# Patient Record
Sex: Male | Born: 1970 | Race: Black or African American | Hispanic: No | Marital: Single | Smoking: Former smoker
Health system: Southern US, Community
[De-identification: ages and names within clinical notes are randomized; demographics above are authoritative.]

## PROBLEM LIST (undated history)

## (undated) DIAGNOSIS — E876 Hypokalemia: Secondary | ICD-10-CM

## (undated) DIAGNOSIS — I10 Essential (primary) hypertension: Secondary | ICD-10-CM

## (undated) DIAGNOSIS — F410 Panic disorder [episodic paroxysmal anxiety] without agoraphobia: Secondary | ICD-10-CM

## (undated) DIAGNOSIS — E785 Hyperlipidemia, unspecified: Secondary | ICD-10-CM

## (undated) HISTORY — PX: HERNIA REPAIR: SHX51

---

## 2014-03-14 ENCOUNTER — Emergency Department (HOSPITAL_BASED_OUTPATIENT_CLINIC_OR_DEPARTMENT_OTHER)
Admission: EM | Admit: 2014-03-14 | Discharge: 2014-03-14 | Disposition: A | Discharge status: Home / Self Care | Payer: Self-pay | Attending: Emergency Medicine | Admitting: Emergency Medicine

## 2014-03-14 ENCOUNTER — Encounter (HOSPITAL_BASED_OUTPATIENT_CLINIC_OR_DEPARTMENT_OTHER): Payer: Self-pay | Admitting: Emergency Medicine

## 2014-03-14 DIAGNOSIS — M545 Low back pain, unspecified: Secondary | ICD-10-CM | POA: Insufficient documentation

## 2014-03-14 DIAGNOSIS — M25562 Pain in left knee: Secondary | ICD-10-CM

## 2014-03-14 DIAGNOSIS — R519 Headache, unspecified: Secondary | ICD-10-CM

## 2014-03-14 DIAGNOSIS — G629 Polyneuropathy, unspecified: Secondary | ICD-10-CM

## 2014-03-14 DIAGNOSIS — R51 Headache: Secondary | ICD-10-CM | POA: Insufficient documentation

## 2014-03-14 DIAGNOSIS — G589 Mononeuropathy, unspecified: Secondary | ICD-10-CM | POA: Insufficient documentation

## 2014-03-14 DIAGNOSIS — M25469 Effusion, unspecified knee: Secondary | ICD-10-CM | POA: Insufficient documentation

## 2014-03-14 DIAGNOSIS — M25561 Pain in right knee: Secondary | ICD-10-CM

## 2014-03-14 DIAGNOSIS — Z87828 Personal history of other (healed) physical injury and trauma: Secondary | ICD-10-CM | POA: Insufficient documentation

## 2014-03-14 DIAGNOSIS — M25569 Pain in unspecified knee: Secondary | ICD-10-CM | POA: Insufficient documentation

## 2014-03-14 LAB — URINALYSIS, ROUTINE W REFLEX MICROSCOPIC
Bilirubin Urine: NEGATIVE
GLUCOSE, UA: NEGATIVE mg/dL
HGB URINE DIPSTICK: NEGATIVE
Ketones, ur: NEGATIVE mg/dL
Leukocytes, UA: NEGATIVE
Nitrite: NEGATIVE
Protein, ur: NEGATIVE mg/dL
SPECIFIC GRAVITY, URINE: 1.013 (ref 1.005–1.030)
UROBILINOGEN UA: 0.2 mg/dL (ref 0.0–1.0)
pH: 5.5 (ref 5.0–8.0)

## 2014-03-14 MED ORDER — IBUPROFEN 800 MG PO TABS: 800.0000 mg | ORAL_TABLET | Freq: Three times a day (TID) | ORAL | Status: DC | PRN

## 2014-03-14 MED ORDER — HYDROCODONE-ACETAMINOPHEN 5-325 MG PO TABS
1.0000 | ORAL_TABLET | ORAL | Status: DC | PRN
Start: 2014-03-14 — End: 2015-09-25

## 2014-03-14 NOTE — Discharge Instructions (Signed)
Back Pain, Adult Low back pain is very common. About 1 in 5 people have back pain.The cause of low back pain is rarely dangerous. The pain often gets better over time.About half of people with a sudden onset of back pain feel better in just 2 weeks. About 8 in 10 people feel better by 6 weeks.  CAUSES Some common causes of back pain include:  Strain of the muscles or ligaments supporting the spine.  Wear and tear (degeneration) of the spinal discs.  Arthritis.  Direct injury to the back. DIAGNOSIS Most of the time, the direct cause of low back pain is not known.However, back pain can be treated effectively even when the exact cause of the pain is unknown.Answering your caregiver's questions about your overall health and symptoms is one of the most accurate ways to make sure the cause of your pain is not dangerous. If your caregiver needs more information, he or she may order lab work or imaging tests (X-rays or MRIs).However, even if imaging tests show changes in your back, this usually does not require surgery. HOME CARE INSTRUCTIONS For many people, back pain returns.Since low back pain is rarely dangerous, it is often a condition that people can learn to [facility] their own.   Remain active. It is stressful on the back to sit or stand in one place. Do not sit, drive, or stand in one place for more than 30 minutes at a time. Take short walks on level surfaces as soon as pain allows.Try to increase the length of time you walk each day.  Do not stay in bed.Resting more than 1 or 2 days can delay your recovery.  Do not avoid exercise or work.Your body is made to move.It is not dangerous to be active, even though your back may hurt.Your back will likely heal faster if you return to being active before your pain is gone.  Pay attention to your body when you bend and lift. Many people have less discomfortwhen lifting if they bend their knees, keep the load close to their bodies,and  avoid twisting. Often, the most comfortable positions are those that put less stress on your recovering back.  Find a comfortable position to sleep. Use a firm mattress and lie on your side with your knees slightly bent. If you lie on your back, put a pillow under your knees.  Only take over-the-counter or prescription medicines as directed by your caregiver. Over-the-counter medicines to reduce pain and inflammation are often the most helpful.Your caregiver may prescribe muscle relaxant drugs.These medicines help dull your pain so you can more quickly return to your normal activities and healthy exercise.  Put ice on the injured area.  Put ice in a plastic bag.  Place a towel between your skin and the bag.  Leave the ice on for 15-20 minutes, 03-04 times a day for the first 2 to 3 days. After that, ice and heat may be alternated to reduce pain and spasms.  Ask your caregiver about trying back exercises and gentle massage. This may be of some benefit.  Avoid feeling anxious or stressed.Stress increases muscle tension and can worsen back pain.It is important to recognize when you are anxious or stressed and learn ways to manage it.Exercise is a great option. SEEK MEDICAL CARE IF:  You have pain that is not relieved with rest or medicine.  You have pain that does not improve in 1 week.  You have new symptoms.  You are generally not feeling well. SEEK  IMMEDIATE MEDICAL CARE IF:   You have pain that radiates from your back into your legs.  You develop new bowel or bladder control problems.  You have unusual weakness or numbness in your arms or legs.  You develop nausea or vomiting.  You develop abdominal pain.  You feel faint. Document Released: 09/23/2005 Document Revised: 03/24/2012 Document Reviewed: 02/11/2011 [facility] Patient Information 2014 [place], [place].  Knee Pain Knee pain can be a result of an injury or other medical conditions. Treatment will depend on the  cause of your pain. HOME CARE  Only take medicine as told by your doctor.  Keep a healthy weight. Being overweight can make the knee hurt more.  Stretch before exercising or playing sports.  If there is constant knee pain, change the way you exercise. Ask your doctor for advice.  Make sure shoes fit well. Choose the right shoe for the sport or activity.  Protect your knees. Wear kneepads if needed.  Rest when you are tired. GET HELP RIGHT AWAY IF:   Your knee pain does not stop.  Your knee pain does not get better.  Your knee joint feels hot to the touch.  You have a fever. MAKE SURE YOU:   Understand these instructions.  Will watch this condition.  Will get help right away if you are not doing well or get worse. Document Released: 12/20/2008 Document Revised: 12/16/2011 Document Reviewed: 12/20/2008 [facility] Patient Information 2014 [place], [place].  Back Exercises Back exercises help treat and prevent back injuries. The goal is to increase your strength in your belly (abdominal) and back muscles. These exercises can also help with flexibility. Start these exercises when told by your doctor. HOME CARE Back exercises include: Pelvic Tilt.  Lie on your back with your knees bent. Tilt your pelvis until the lower part of your back is against the floor. Hold this position 5 to 10 sec. Repeat this exercise 5 to 10 times. Knee to Chest.  Pull 1 knee up against your chest and hold for 20 to 30 seconds. Repeat this with the other knee. This may be done with the other leg straight or bent, whichever feels better. Then, pull both knees up against your chest. Sit-Ups or Curl-Ups.  Bend your knees 90 degrees. Start with tilting your pelvis, and do a partial, slow sit-up. Only lift your upper half 30 to 45 degrees off the floor. Take at least 2 to 3 seonds for each sit-up. Do not do sit-ups with your knees out straight. If partial sit-ups are difficult, simply do the above but with  only tightening your belly (abdominal) muscles and holding it as told. Hip-Lift.  Lie on your back with your knees flexed 90 degrees. Push down with your feet and shoulders as you raise your hips 2 inches off the floor. Hold for 10 seconds, repeat 5 to 10 times. Back Arches.  Lie on your stomach. Prop yourself up on bent elbows. Slowly press on your hands, causing an arch in your low back. Repeat 3 to 5 times. Shoulder-Lifts.  Lie face down with arms beside your body. Keep hips and belly pressed to floor as you slowly lift your head and shoulders off the floor. Do not overdo your exercises. Be careful in the beginning. Exercises may cause you some mild back discomfort. If the pain lasts for more than 15 minutes, stop the exercises until you see your doctor. Improvement with exercise for back problems is slow.  Document Released: 10/26/2010 Document Revised: 12/16/2011 Document  Reviewed: 07/25/2011 ExitCare Patient Information 2014 [place], [place]. RICE: Routine Care for Injuries The routine care of many injuries includes Rest, Ice, Compression, and Elevation (RICE). HOME CARE INSTRUCTIONS  Rest is needed to allow your body to heal. Routine activities can usually be resumed when comfortable. Injured tendons and bones can take up to 6 weeks to heal. Tendons are the cord-like structures that attach muscle to bone.  Ice following an injury helps keep the swelling down and reduces pain.  Put ice in a plastic bag.  Place a towel between your skin and the bag.  Leave the ice on for 15-20 minutes, 03-04 times a day. Do this while awake, for the first 24 to 48 hours. After that, continue as directed by your caregiver.  Compression helps keep swelling down. It also gives support and helps with discomfort. If an elastic bandage has been applied, it should be removed and reapplied every 3 to 4 hours. It should not be applied tightly, but firmly enough to keep swelling down. Watch fingers or toes for  swelling, bluish discoloration, coldness, numbness, or excessive pain. If any of these problems occur, remove the bandage and reapply loosely. Contact your caregiver if these problems continue.  Elevation helps reduce swelling and decreases pain. With extremities, such as the arms, hands, legs, and feet, the injured area should be placed near or above the level of the heart, if possible. SEEK IMMEDIATE MEDICAL CARE IF:  You have persistent pain and swelling.  You develop redness, numbness, or unexpected weakness.  Your symptoms are getting worse rather than improving after several days. These symptoms may indicate that further evaluation or further X-rays are needed. Sometimes, X-rays may not show a small broken bone (fracture) until 1 week or 10 days later. Make a follow-up appointment with your caregiver. Ask when your X-ray results will be ready. Make sure you get your X-ray results. Document Released: 01/05/2001 Document Revised: 12/16/2011 Document Reviewed: 02/22/2011 [facility] Patient Information 2014 [place], [place].    Emergency Department Resource Guide 1) Find a Doctor and Pay Out of Pocket Although you won't have to find out who is covered by your insurance plan, it is a good idea to ask around and get recommendations. You will then need to call the office and see if the doctor you have chosen will accept you as a new patient and what types of options they offer for patients who are self-pay. Some doctors offer discounts or will set up payment plans for their patients who do not have insurance, but you will need to ask so you aren't surprised when you get to your appointment.  2) Contact Your Local Health Department Not all health departments have doctors that can see patients for sick visits, but many do, so it is worth a call to see if yours does. If you don't know where your local health department is, you can check in your phone book. The CDC also has a tool to help you locate  your state's health department, and many state websites also have listings of all of their local health departments.  3) Find a Walk-in Clinic If your illness is not likely to be very severe or complicated, you may want to try a walk in clinic. These are popping up all over the country in pharmacies, drugstores, and shopping centers. They're usually staffed by nurse practitioners or physician assistants that have been trained to treat common illnesses and complaints. They're usually fairly quick and inexpensive. However, if you have  serious medical issues or chronic medical problems, these are probably not your best option.  No Primary Care Doctor: - Call Health Connect at  [phone] - they can help you locate a primary care doctor that  accepts your insurance, provides certain services, etc. - Physician Referral Service- [phone]  Chronic Pain Problems: Organization         Address  Phone   Notes  [facility]  [phone] Patients need to be referred by their primary care doctor.   Medication Assistance: Organization         Address  Phone   Notes  [facility] [address] [place], [place] [address] [phone]) [phone] --Must be a resident of [facility] -- Must have NO insurance coverage whatsoever (no Medicaid/ Medicare, etc.) -- The pt. MUST have a primary care doctor that directs their care regularly and follows them in the community   MedAssist  [phone]) [phone]   [employer]  [phone]) [phone]    Agencies that provide inexpensive medical care: Organization         Address  Phone   Notes  [facility]  [phone]   [facility]    [phone]) [phone]   [facility] [address] [place], [place] [address] [phone]) [phone]   [facility] [address], [place] [phone]   [facility]    [phone]   [facility]     [phone]) [phone]   [facility]  [address], [place] Phone:  [phone], Fax:  [phone] Hours of Operation:  9 am - 6 pm, M-F.  Also accepts Medicaid/Medicare and self-pay.  [facility]  [address], [place] Phone: [phone](336) [phone], Fax: [phone]. Hours of Operation:  8:30 am - 5:30 pm, M-F.  Also accepts Medicaid and self-pay.  [facility] [place] [address], [place] Phone: [phone]) [phone]   [facility] [address], [place], [place] [phone], Ext. [phone] Mondays & Thursdays: 7-9 AM.  First 15 patients are seen on a first come, first serve basis.    Medicaid-accepting [facility] Providers:  Organization         Address  Phone   Notes  [facility] [address], [place] [phone] Also accepts self-pay patients.  [facility] [address] [place], [place]  [phone]) [phone]   [facility] [address], [place] [phone]) [phone]   [facility] [address] [place] [address], [place] [phone]) [phone]   Renaye RakersVeita Bland [address], [place]   [phone] Only accepts [place] Access [place] patients after they have their name applied to their card.   Self-Pay (no insurance) in [facility]:  Organization         Address  Phone   Notes  Sickle Cell Patients, [facility] [address], [place] [phone]   [facility] [address], [place] [phone]) [phone]   [facility] [place]  [address], [place] [phone]   [facility]/Dr. Osei-Bonsu  [address] [place] [address], [place] or [phone], [place] [phone]) [phone] Phone number for both [place] and [place] locations is  the same.  [facility] [address] [place] [phone]   [facility] [address], [place] or [address] [phone]   [facility] [address], [place] [phone], phone; [phone], fax Sees patients 1st and 3rd Saturday of every month.  Must not qualify for public or private insurance (i.e. Medicaid, Medicare, [place] Health Choice, Veterans' Benefits)  Household income should be no more than 200% of the poverty level The clinic cannot treat you if you are pregnant or think you are pregnant  Sexually transmitted diseases are not treated at the clinic.    Dental Care: Organization         Address  Phone  Notes  [facility] [address] [place], [place] [phone] Accepts children up to age 37 who are enrolled in [place] or [place] Health Choice; pregnant women with a Medicaid card; and children who have applied for Medicaid or [place] Health Choice, but were declined, whose parents can pay a reduced fee at time of service.  [facility]  [address], [place] [phone] Accepts children up to age 55 who are enrolled in [place] or [place] Health Choice; pregnant women with a Medicaid card; and children who have applied for Medicaid or [place] Health Choice, but were declined, whose parents can pay a reduced fee at time of service.  [facility]  [address] [place], [place] [phone] Patients are seen by appointment only. Walk-ins are not accepted. [facility] will see patients 62 years of age and older. Monday - Tuesday (8am-5pm) Most Wednesdays (8:30-5pm) $30 per visit, cash only  [facility]  [address], [facility] [phone] Patients are seen by appointment only. Walk-ins are not accepted. [facility] will see patients 21 years of age and older. One Wednesday Evening (Monthly: Volunteer  Based).  $30 per visit, cash only  [employer]  [phone] for adults; Children under age 58, call Graduate Pediatric Dentistry at [phone]. Children aged 29-14, please call [phone] to request a pediatric application.  Dental services are provided in all areas of dental care including fillings, crowns and bridges, complete and partial dentures, implants, gum treatment, root canals, and extractions. Preventive care is also provided. Treatment is provided to both adults and children. Patients are selected via a lottery and there is often a waiting list.   [facility] [address] [place] [address] [place]  [phone] [email or web address]   [facility] [address] [place], [place] [phone], Ext. [phone] Second and Fourth Thursday of each month, opens at 6:30 AM; Clinic ends at 9 AM.  Patients are seen on a first-come first-served basis, and a limited number are seen during each clinic.   [facility]  [address] [place], [place] [phone]   Eligibility Requirements You must have lived in [place], [place], or [place] counties for at least the last three months.   You cannot be eligible for state or federal sponsored [place], including [employer], [place], or [employer].   You generally cannot be eligible for healthcare insurance through your employer.    How to apply: Eligibility screenings are held every Tuesday and Wednesday afternoon from 1:00 pm until 4:00 pm. You do not need an appointment  for the interview!  [facility] [address], [place], [place] [phone]   [facility]  [phone]   [facility] Health Department  [phone]   [facility] Health Department  [phone]    Behavioral Health Resources in the Community: Intensive Outpatient Programs Organization         Address  Phone  Notes  [facility]  Services [address] [place], [place] [phone]   [facility] Outpatient [address] [place], [place] [phone]   [facility] [address], [place], [place]  [phone]   [facility] Mental Health [address]  [place], [place] [phone] or [phone]   Substance Abuse Resources Organization         Address  Phone  Notes  [facility] Drug Services  [phone]   [facility]  [phone]   [facility] [place]  [phone]   Floydene Flock  [phone]   Residential & Outpatient Substance Abuse Program  [phone]   Psychological Services Organization         Address  Phone  Notes  [facility] Health  [phone]   [facility]  [phone]   [facility] Mental Health [address] [place] [phone] or [phone]    Mobile Crisis Teams Organization         Address  Phone  Notes  [facility], Mobile Crisis Care Unit  [phone]   [facility]  [address] [place], [place] [phone]   Doristine Locks [address] [place] [phone]    Self-Help/Support Groups Organization         Address  Phone             Notes  [facility] - variety of support groups  [phone]- I7437963 Call for more information  Narcotics Anonymous (NA), [facility] [address], [employer] [place]  2 meetings at this location   Statistician         Address  Phone  Notes  [facility] [address],    [place]  [phone]   [facility]  [address] [place] [address], [place], [place] [phone]   [facility] [address] [place], [place] [phone] Admissions: 8am-3pm M-F  [facility] [address],    [place], [place] [phone]   [facility] [address] [place], [place], [place] [phone]    [facility] [address],  [place], [place] [phone]   [facility] - Intensive Outpatient [address], [place], [place] [phone]   [facility] ([facility]iction [facility] [address] [place]  [place], [place] [phone] or [phone]   [facility] (RTS) [address], [place], [place] [phone] Accepts Medicaid  [facility] [place] [address],  [place] [phone] Substance Abuse/Addiction Treatment   [facility] Organization         Address  Phone  Notes  [facility]  [phone]   Angie Fava, PhD [address] [place], [place]   [phone] or [phone]   [facility] Behavioral   [address] [place], [place] [phone]   [facility] [address], [place], [place] [phone] Insurance/Medicaid/sponsorship through [employer] and Families [address]  [place], [place] [phone] [place], [place] [phone]    Dr. Adele Schilder  [phone]   [facility] Dept. 1) [address] [place] 2) [place] 3)  [place], [place] [phone]  [phone]   [place] [phone] or [phone] (After Hours)
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
## 2014-03-14 NOTE — ED Notes (Signed)
Lower back pain with radiation into his left leg. Knees have been swelling with standing for the past month.

## 2015-03-30 ENCOUNTER — Encounter (HOSPITAL_BASED_OUTPATIENT_CLINIC_OR_DEPARTMENT_OTHER): Payer: Self-pay | Admitting: *Deleted

## 2015-03-30 ENCOUNTER — Other Ambulatory Visit: Payer: Self-pay

## 2015-03-30 ENCOUNTER — Emergency Department (HOSPITAL_BASED_OUTPATIENT_CLINIC_OR_DEPARTMENT_OTHER)
Admission: EM | Admit: 2015-03-30 | Discharge: 2015-03-30 | Disposition: A | Discharge status: Home / Self Care | Payer: Self-pay | Attending: Emergency Medicine | Admitting: Emergency Medicine

## 2015-03-30 DIAGNOSIS — R109 Unspecified abdominal pain: Secondary | ICD-10-CM | POA: Insufficient documentation

## 2015-03-30 DIAGNOSIS — R079 Chest pain, unspecified: Secondary | ICD-10-CM | POA: Insufficient documentation

## 2015-03-30 DIAGNOSIS — R531 Weakness: Secondary | ICD-10-CM

## 2015-03-30 DIAGNOSIS — E876 Hypokalemia: Secondary | ICD-10-CM | POA: Insufficient documentation

## 2015-03-30 DIAGNOSIS — Z79899 Other long term (current) drug therapy: Secondary | ICD-10-CM | POA: Insufficient documentation

## 2015-03-30 DIAGNOSIS — I10 Essential (primary) hypertension: Secondary | ICD-10-CM | POA: Insufficient documentation

## 2015-03-30 DIAGNOSIS — R0602 Shortness of breath: Secondary | ICD-10-CM | POA: Insufficient documentation

## 2015-03-30 HISTORY — DX: Essential (primary) hypertension: I10

## 2015-03-30 HISTORY — DX: Hyperlipidemia, unspecified: E78.5

## 2015-03-30 LAB — COMPREHENSIVE METABOLIC PANEL
ALT: 36 U/L (ref 17–63)
AST: 33 U/L (ref 15–41)
Albumin: 4.7 g/dL (ref 3.5–5.0)
Alkaline Phosphatase: 65 U/L (ref 38–126)
Anion gap: 11 (ref 5–15)
BUN: 12 mg/dL (ref 6–20)
CO2: 28 mmol/L (ref 22–32)
Calcium: 9.9 mg/dL (ref 8.9–10.3)
Chloride: 94 mmol/L — ABNORMAL LOW (ref 101–111)
Creatinine, Ser: 1.2 mg/dL (ref 0.61–1.24)
GFR calc Af Amer: >60 mL/min (ref >60)
GLUCOSE: 144 mg/dL — AB (ref 65–99)
POTASSIUM: 3 mmol/L — AB (ref 3.5–5.1)
SODIUM: 133 mmol/L — AB (ref 135–145)
TOTAL PROTEIN: 8.2 g/dL — AB (ref 6.5–8.1)
Total Bilirubin: 0.9 mg/dL (ref 0.3–1.2)

## 2015-03-30 LAB — CBC WITH DIFFERENTIAL/PLATELET
BASOS PCT: 1 % (ref 0–1)
Basophils Absolute: 0 10*3/uL (ref 0.0–0.1)
EOS ABS: 0 10*3/uL (ref 0.0–0.7)
Eosinophils Relative: 1 % (ref 0–5)
HCT: 46 % (ref 39.0–52.0)
Hemoglobin: 16.5 g/dL (ref 13.0–17.0)
Lymphocytes Relative: 29 % (ref 12–46)
Lymphs Abs: 1.9 10*3/uL (ref 0.7–4.0)
MCH: 30.6 pg (ref 26.0–34.0)
MCHC: 35.9 g/dL (ref 30.0–36.0)
MCV: 85.2 fL (ref 78.0–100.0)
Monocytes Absolute: 0.7 10*3/uL (ref 0.1–1.0)
Monocytes Relative: 10 % (ref 3–12)
NEUTROS ABS: 3.9 10*3/uL (ref 1.7–7.7)
NEUTROS PCT: 59 % (ref 43–77)
Platelets: 202 10*3/uL (ref 150–400)
RBC: 5.4 MIL/uL (ref 4.22–5.81)
RDW: 11.5 % (ref 11.5–15.5)
WBC: 6.5 10*3/uL (ref 4.0–10.5)

## 2015-03-30 LAB — TROPONIN I: Troponin I: <0.03 ng/mL (ref <0.031)

## 2015-03-30 LAB — TSH: TSH: 0.934 u[IU]/mL (ref 0.350–4.500)

## 2015-03-30 MED ORDER — SODIUM CHLORIDE 0.9 % IV BOLUS (SEPSIS)
1000.0000 mL | Freq: Once | INTRAVENOUS | Status: AC
  Administered 2015-03-30: 1000 mL via INTRAVENOUS

## 2015-03-30 MED ORDER — POTASSIUM CHLORIDE ER 10 MEQ PO TBCR: 20.0000 meq | EXTENDED_RELEASE_TABLET | Freq: Two times a day (BID) | ORAL | Status: DC

## 2015-03-30 NOTE — Discharge Instructions (Signed)
Potassium replacement as prescribed.  Turn to the emergency department if your symptoms significantly worsen or change.   Hypokalemia Hypokalemia means that the amount of potassium in the blood is lower than normal.Potassium is a chemical, called an electrolyte, that helps regulate the amount of fluid in the body. It also stimulates muscle contraction and helps nerves function properly.Most of the body's potassium is inside of cells, and only a very small amount is in the blood. Because the amount in the blood is so small, minor changes can be life-threatening. CAUSES  Antibiotics.  Diarrhea or vomiting.  Using laxatives too much, which can cause diarrhea.  Chronic kidney disease.  Water pills (diuretics).  Eating disorders (bulimia).  Low magnesium level.  Sweating a lot. SIGNS AND SYMPTOMS  Weakness.  Constipation.  Fatigue.  Muscle cramps.  Mental confusion.  Skipped heartbeats or irregular heartbeat (palpitations).  Tingling or numbness. DIAGNOSIS  Your health care provider can diagnose hypokalemia with blood tests. In addition to checking your potassium level, your health care provider may also check other lab tests. TREATMENT Hypokalemia can be treated with potassium supplements taken by mouth or adjustments in your current medicines. If your potassium level is very low, you may need to get potassium through a vein (IV) and be monitored in the hospital. A diet high in potassium is also helpful. Foods high in potassium are:  Nuts, such as peanuts and pistachios.  Seeds, such as sunflower seeds and pumpkin seeds.  Peas, lentils, and lima beans.  Whole grain and bran cereals and breads.  Fresh fruit and vegetables, such as apricots, avocado, bananas, cantaloupe, kiwi, oranges, tomatoes, asparagus, and potatoes.  Orange and tomato juices.  Red meats.  Fruit yogurt. HOME CARE INSTRUCTIONS  Take all medicines as prescribed by your health care  provider.  Maintain a healthy diet by including nutritious food, such as fruits, vegetables, nuts, whole grains, and lean meats.  If you are taking a laxative, be sure to follow the directions on the label. SEEK MEDICAL CARE IF:  Your weakness gets worse.  You feel your heart pounding or racing.  You are vomiting or having diarrhea.  You are diabetic and having trouble keeping your blood glucose in the normal range. SEEK IMMEDIATE MEDICAL CARE IF:  You have chest pain, shortness of breath, or dizziness.  You are vomiting or having diarrhea for more than 2 days.  You faint. MAKE SURE YOU:   Understand these instructions.  Will watch your condition.  Will get help right away if you are not doing well or get worse. Document Released: 09/23/2005 Document Revised: 07/14/2013 Document Reviewed: 03/26/2013 [facility] Patient Information 2015 [place], [place]. This information is not intended to replace advice given to you by your health care provider. Make sure you discuss any questions you have with your health care provider.  Fatigue Fatigue is a feeling of tiredness, lack of energy, lack of motivation, or feeling tired all the time. Having enough rest, good nutrition, and reducing stress will normally reduce fatigue. Consult your caregiver if it persists. The nature of your fatigue will help your caregiver to find out its cause. The treatment is based on the cause.  CAUSES  There are many causes for fatigue. Most of the time, fatigue can be traced to one or more of your habits or routines. Most causes fit into one or more of three general areas. They are: Lifestyle problems  Sleep disturbances.  Overwork.  Physical exertion.  Unhealthy habits.  Poor eating habits  or eating disorders.  Alcohol and/or drug use .  Lack of proper nutrition (malnutrition). Psychological problems  Stress and/or anxiety problems.  Depression.  Grief.  Boredom. Medical Problems or  Conditions  Anemia.  Pregnancy.  Thyroid gland problems.  Recovery from major surgery.  Continuous pain.  Emphysema or asthma that is not well controlled  Allergic conditions.  Diabetes.  Infections (such as mononucleosis).  Obesity.  Sleep disorders, such as sleep apnea.  Heart failure or other heart-related problems.  Cancer.  Kidney disease.  Liver disease.  Effects of certain medicines such as antihistamines, cough and cold remedies, prescription pain medicines, heart and blood pressure medicines, drugs used for treatment of cancer, and some antidepressants. SYMPTOMS  The symptoms of fatigue include:   Lack of energy.  Lack of drive (motivation).  Drowsiness.  Feeling of indifference to the surroundings. DIAGNOSIS  The details of how you feel help guide your caregiver in finding out what is causing the fatigue. You will be asked about your present and past health condition. It is important to review all medicines that you take, including prescription and non-prescription items. A thorough exam will be done. You will be questioned about your feelings, habits, and normal lifestyle. Your caregiver may suggest blood tests, urine tests, or other tests to look for common medical causes of fatigue.  TREATMENT  Fatigue is treated by correcting the underlying cause. For example, if you have continuous pain or depression, treating these causes will improve how you feel. Similarly, adjusting the dose of certain medicines will help in reducing fatigue.  HOME CARE INSTRUCTIONS   Try to get the required amount of good sleep every night.  Eat a healthy and nutritious diet, and drink enough water throughout the day.  Practice ways of relaxing (including yoga or meditation).  Exercise regularly.  Make plans to change situations that cause stress. Act on those plans so that stresses decrease over time. Keep your work and personal routine reasonable.  Avoid street drugs  and minimize use of alcohol.  Start taking a daily multivitamin after consulting your caregiver. SEEK MEDICAL CARE IF:   You have persistent tiredness, which cannot be accounted for.  You have fever.  You have unintentional weight loss.  You have headaches.  You have disturbed sleep throughout the night.  You are feeling sad.  You have constipation.  You have dry skin.  You have gained weight.  You are taking any new or different medicines that you suspect are causing fatigue.  You are unable to sleep at night.  You develop any unusual swelling of your legs or other parts of your body. SEEK IMMEDIATE MEDICAL CARE IF:   You are feeling confused.  Your vision is blurred.  You feel faint or pass out.  You develop severe headache.  You develop severe abdominal, pelvic, or back pain.  You develop chest pain, shortness of breath, or an irregular or fast heartbeat.  You are unable to pass a normal amount of urine.  You develop abnormal bleeding such as bleeding from the rectum or you vomit blood.  You have thoughts about harming yourself or committing suicide.  You are worried that you might harm someone else. MAKE SURE YOU:   Understand these instructions.  Will watch your condition.  Will get help right away if you are not doing well or get worse. Document Released: 07/21/2007 Document Revised: 12/16/2011 Document Reviewed: 01/25/2014 [facility] Patient Information 2015 [place], [place]. This information is not intended to replace advice  given to you by your health care provider. Make sure you discuss any questions you have with your health care provider.

## 2015-03-30 NOTE — ED Notes (Addendum)
Pt sts he is feeling weak x4 days. Pt was seen on the 16 at [facility] and has a f/u appt on the 28. Pt with multiple complaints.Pt walked to room unassisted and with steady gait.

## 2015-03-30 NOTE — ED Provider Notes (Signed)
CSN: [id number]     Arrival date & time 03/30/15  1130 History   First MD Initiated Contact with Patient 03/30/15 1150     Chief Complaint  Patient presents with  . Weakness     (Consider location/radiation/quality/duration/timing/severity/associated sxs/prior Treatment) HPI Comments: Patient is a 44 year old male with history of hypertension. He presents for evaluation of weakness that has been present for the past 4 days. He is also reporting tightness in his chest, numbness and tingling in his arms, and occasional discomfort in his abdomen. He reports constipation which has since resolved after using magnesium citrate.  He tells me he was seen at another facility prior to the onset of symptoms. This seemed to begin after he had his blood drawn. He does not know the results of his prior blood tests.  Patient is a 44 y.o. male presenting with weakness. The history is provided by the patient.  Weakness This is a new problem. Episode onset: 4 days ago. The problem occurs constantly. The problem has not changed since onset.Associated symptoms include chest pain, abdominal pain and shortness of breath. Nothing aggravates the symptoms. Nothing relieves the symptoms. He has tried nothing for the symptoms. The treatment provided no relief.    Past Medical History  Diagnosis Date  . Hypertension   . Hyperlipidemia    Past Surgical History  Procedure Laterality Date  . Hernia repair     No family history on file. History  Substance Use Topics  . Smoking status: Never Smoker   . Smokeless tobacco: Not on file  . Alcohol Use: No    Review of Systems  Respiratory: Positive for shortness of breath.   Cardiovascular: Positive for chest pain.  Gastrointestinal: Positive for abdominal pain.  Neurological: Positive for weakness.  All other systems reviewed and are negative.     Allergies  Review of patient's allergies indicates no known allergies.  Home Medications   Prior to  Admission medications   Medication Sig Start Date End Date Taking? Authorizing Provider  hydrochlorothiazide (HYDRODIURIL) 25 MG tablet Take 25 mg by mouth daily.   Yes Historical Provider, MD  HYDROcodone-acetaminophen (NORCO/VICODIN) 5-325 MG per tablet Take 1 tablet by mouth every 4 (four) hours as needed. 03/14/14   Kristen N Ward, DO  ibuprofen (ADVIL,MOTRIN) 800 MG tablet Take 1 tablet (800 mg total) by mouth every 8 (eight) hours as needed for mild pain. 03/14/14   Kristen N Ward, DO   BP 160/88 mmHg  Pulse 100  Temp(Src) 98.2 F (36.8 C) (Oral)  Resp 18  Ht 6\' 1"  (1.854 m)  Wt 220 lb (99.791 kg)  BMI 29.03 kg/m2  SpO2 98% Physical Exam  Constitutional: He is oriented to person, place, and time. He appears well-developed and well-nourished. No distress.  HENT:  Head: Normocephalic and atraumatic.  Mouth/Throat: Oropharynx is clear and moist.  Eyes: EOM are normal. Pupils are equal, round, and reactive to light.  Neck: Normal range of motion. Neck supple.  Cardiovascular: Normal rate, regular rhythm and intact distal pulses.   No murmur heard. Pulmonary/Chest: Effort normal and breath sounds normal. No respiratory distress. He has no wheezes.  Abdominal: Soft. Bowel sounds are normal.  Musculoskeletal: Normal range of motion. He exhibits no edema.  Lymphadenopathy:    He has no cervical adenopathy.  Neurological: He is alert and oriented to person, place, and time. No cranial nerve deficit. He exhibits normal muscle tone. Coordination normal.  Skin: Skin is warm and dry. He is not diaphoretic.  Nursing note and vitals reviewed.   ED Course  Procedures (including critical care time) Labs Review Labs Reviewed  COMPREHENSIVE METABOLIC PANEL  TROPONIN I  CBC WITH DIFFERENTIAL/PLATELET  TSH    Imaging Review No results found.   EKG Interpretation   Date/Time:  Thursday March 30 2015 12:17:27 EDT Ventricular Rate:  90 PR Interval:  176 QRS Duration: 92 QT Interval:   360 QTC Calculation: 440 R Axis:   33 Text Interpretation:  Normal sinus rhythm Biatrial enlargement Incomplete  right bundle branch block Nonspecific T wave abnormality Abnormal ECG  Confirmed by Kaleth Koy  MD, Merleen Picazo ([id number]) on 03/30/2015 12:22:52 PM      MDM   Final diagnoses:  None    Patient is a 44 year old male who presents with weakness for the past several days. His physical examination is unremarkable and he appears healthy and well. His vitals are stable. His workup reveals Normal CBC, negative troponin, unchanged EKG and electrolyte panel that is unremarkable with the exception of a potassium of 3.0. I suspect this is the likely cause of his weakness. This will be treated with oral potassium and when necessary return.   Geoffery Lyons, MD 03/30/15 1359

## 2015-09-25 ENCOUNTER — Encounter (HOSPITAL_BASED_OUTPATIENT_CLINIC_OR_DEPARTMENT_OTHER): Payer: Self-pay | Admitting: *Deleted

## 2015-09-25 ENCOUNTER — Emergency Department (HOSPITAL_BASED_OUTPATIENT_CLINIC_OR_DEPARTMENT_OTHER): Payer: No Typology Code available for payment source

## 2015-09-25 ENCOUNTER — Emergency Department (HOSPITAL_BASED_OUTPATIENT_CLINIC_OR_DEPARTMENT_OTHER)
Admission: EM | Admit: 2015-09-25 | Discharge: 2015-09-25 | Disposition: A | Discharge status: Home / Self Care | Payer: No Typology Code available for payment source | Attending: Emergency Medicine | Admitting: Emergency Medicine

## 2015-09-25 DIAGNOSIS — Z8639 Personal history of other endocrine, nutritional and metabolic disease: Secondary | ICD-10-CM | POA: Diagnosis not present

## 2015-09-25 DIAGNOSIS — M542 Cervicalgia: Secondary | ICD-10-CM

## 2015-09-25 DIAGNOSIS — Y92410 Unspecified street and highway as the place of occurrence of the external cause: Secondary | ICD-10-CM | POA: Diagnosis not present

## 2015-09-25 DIAGNOSIS — Y9389 Activity, other specified: Secondary | ICD-10-CM | POA: Diagnosis not present

## 2015-09-25 DIAGNOSIS — S0990XA Unspecified injury of head, initial encounter: Secondary | ICD-10-CM | POA: Diagnosis present

## 2015-09-25 DIAGNOSIS — S199XXA Unspecified injury of neck, initial encounter: Secondary | ICD-10-CM | POA: Diagnosis not present

## 2015-09-25 DIAGNOSIS — S3992XA Unspecified injury of lower back, initial encounter: Secondary | ICD-10-CM | POA: Insufficient documentation

## 2015-09-25 DIAGNOSIS — M545 Low back pain, unspecified: Secondary | ICD-10-CM

## 2015-09-25 DIAGNOSIS — I10 Essential (primary) hypertension: Secondary | ICD-10-CM | POA: Diagnosis not present

## 2015-09-25 DIAGNOSIS — R51 Headache: Secondary | ICD-10-CM

## 2015-09-25 DIAGNOSIS — R519 Headache, unspecified: Secondary | ICD-10-CM

## 2015-09-25 DIAGNOSIS — Y998 Other external cause status: Secondary | ICD-10-CM | POA: Diagnosis not present

## 2015-09-25 MED ORDER — IBUPROFEN 800 MG PO TABS
800.0000 mg | ORAL_TABLET | Freq: Three times a day (TID) | ORAL | Status: DC
Start: 2015-09-25 — End: 2016-05-08

## 2015-09-25 MED ORDER — HYDROCODONE-ACETAMINOPHEN 5-325 MG PO TABS
2.0000 | ORAL_TABLET | Freq: Once | ORAL | Status: AC
  Administered 2015-09-25: 2 via ORAL
  Filled 2015-09-25: qty 2

## 2015-09-25 MED ORDER — CYCLOBENZAPRINE HCL 10 MG PO TABS: 10.0000 mg | ORAL_TABLET | Freq: Two times a day (BID) | ORAL | Status: DC | PRN

## 2015-09-25 MED ORDER — HYDROCODONE-ACETAMINOPHEN 5-325 MG PO TABS: 2.0000 | ORAL_TABLET | ORAL | Status: DC | PRN

## 2015-09-25 MED ORDER — CYCLOBENZAPRINE HCL 10 MG PO TABS
10.0000 mg | ORAL_TABLET | Freq: Once | ORAL | Status: AC
  Administered 2015-09-25: 10 mg via ORAL
  Filled 2015-09-25: qty 1
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
## 2015-09-25 NOTE — ED Notes (Signed)
Pt has called his brother to pick him up, verbalizes understanding that he cannot drive home after taking the pain medication.
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
## 2015-10-20 ENCOUNTER — Encounter (HOSPITAL_BASED_OUTPATIENT_CLINIC_OR_DEPARTMENT_OTHER): Payer: Self-pay | Admitting: *Deleted

## 2015-10-20 ENCOUNTER — Emergency Department (HOSPITAL_BASED_OUTPATIENT_CLINIC_OR_DEPARTMENT_OTHER)
Admission: EM | Admit: 2015-10-20 | Discharge: 2015-10-20 | Disposition: A | Discharge status: Home / Self Care | Payer: Self-pay | Attending: Emergency Medicine | Admitting: Emergency Medicine

## 2015-10-20 ENCOUNTER — Emergency Department (HOSPITAL_BASED_OUTPATIENT_CLINIC_OR_DEPARTMENT_OTHER): Payer: Self-pay

## 2015-10-20 DIAGNOSIS — I10 Essential (primary) hypertension: Secondary | ICD-10-CM | POA: Insufficient documentation

## 2015-10-20 DIAGNOSIS — R079 Chest pain, unspecified: Secondary | ICD-10-CM | POA: Insufficient documentation

## 2015-10-20 DIAGNOSIS — Z79899 Other long term (current) drug therapy: Secondary | ICD-10-CM | POA: Insufficient documentation

## 2015-10-20 DIAGNOSIS — Z791 Long term (current) use of non-steroidal anti-inflammatories (NSAID): Secondary | ICD-10-CM | POA: Insufficient documentation

## 2015-10-20 DIAGNOSIS — R739 Hyperglycemia, unspecified: Secondary | ICD-10-CM | POA: Insufficient documentation

## 2015-10-20 DIAGNOSIS — R0602 Shortness of breath: Secondary | ICD-10-CM | POA: Insufficient documentation

## 2015-10-20 DIAGNOSIS — F410 Panic disorder [episodic paroxysmal anxiety] without agoraphobia: Secondary | ICD-10-CM | POA: Insufficient documentation

## 2015-10-20 DIAGNOSIS — Z8639 Personal history of other endocrine, nutritional and metabolic disease: Secondary | ICD-10-CM | POA: Insufficient documentation

## 2015-10-20 HISTORY — DX: Panic disorder (episodic paroxysmal anxiety): F41.0

## 2015-10-20 LAB — BASIC METABOLIC PANEL
Anion gap: 6 (ref 5–15)
BUN: 16 mg/dL (ref 6–20)
CALCIUM: 8.7 mg/dL — AB (ref 8.9–10.3)
CO2: 24 mmol/L (ref 22–32)
CREATININE: 1.12 mg/dL (ref 0.61–1.24)
Chloride: 101 mmol/L (ref 101–111)
GFR calc Af Amer: >60 mL/min (ref >60)
GFR calc non Af Amer: >60 mL/min (ref >60)
Glucose, Bld: 223 mg/dL — ABNORMAL HIGH (ref 65–99)
Potassium: 3.5 mmol/L (ref 3.5–5.1)
SODIUM: 131 mmol/L — AB (ref 135–145)

## 2015-10-20 LAB — CBC
HCT: 39 % (ref 39.0–52.0)
Hemoglobin: 13.6 g/dL (ref 13.0–17.0)
MCH: 30.2 pg (ref 26.0–34.0)
MCHC: 34.9 g/dL (ref 30.0–36.0)
MCV: 86.7 fL (ref 78.0–100.0)
PLATELETS: 167 10*3/uL (ref 150–400)
RBC: 4.5 MIL/uL (ref 4.22–5.81)
RDW: 11.9 % (ref 11.5–15.5)
WBC: 5.9 10*3/uL (ref 4.0–10.5)

## 2015-10-20 LAB — TROPONIN I: Troponin I: <0.03 ng/mL (ref <0.031)

## 2015-10-20 MED ORDER — ACETAMINOPHEN 325 MG PO TABS
650.0000 mg | ORAL_TABLET | Freq: Once | ORAL | Status: AC
  Administered 2015-10-20: 650 mg via ORAL
  Filled 2015-10-20: qty 2

## 2015-10-20 NOTE — ED Notes (Addendum)
States his left hand is numb, he can't breathe, feels shaky sudden onset. Hx of panic attacks. Cramping in his left chest.

## 2015-10-20 NOTE — ED Notes (Signed)
Patient stable and ambulatory. Patient verbalizes understanding of discharge instructions and follow-up. 

## 2015-10-20 NOTE — ED Provider Notes (Signed)
CSN: [id number]     Arrival date & time 10/20/15  1150 History   First MD Initiated Contact with Patient 10/20/15 1203     Chief Complaint  Patient presents with  . Panic Attack     (Consider location/radiation/quality/duration/timing/severity/associated sxs/prior Treatment) HPI Comments: Patient with history of hypertension presents with complaint of chest pain. Patient developed left sided chest pain described as a cramping pain which then radiated to his parasternal area. Patient then became very nervous. Patient states that he has a history of panic attacks but has never had symptoms like this with previous panic attacks. This is associated with short-lived period of shortness of breath. No palpitations, lightheadedness. No nausea or vomiting. Patient denies history of high cholesterol, diabetes. Patient smokes marijuana but not tobacco. No history of heart disease in a first-degree relative. Patient denies risk factors for pulmonary embolism including: unilateral leg swelling, history of DVT/PE/other blood clots, use of exogenous hormones, recent immobilizations, recent surgery, recent travel (>4hr segment), malignancy, hemoptysis. Patient does report lifting weights recently.    The history is provided by the patient and medical records.    Past Medical History  Diagnosis Date  . Hypertension   . Hyperlipidemia   . Panic attacks    Past Surgical History  Procedure Laterality Date  . Hernia repair     No family history on file. Social History  Substance Use Topics  . Smoking status: Never Smoker   . Smokeless tobacco: None  . Alcohol Use: No    Review of Systems  Constitutional: Negative for fever and diaphoresis.  Eyes: Negative for redness.  Respiratory: Positive for shortness of breath. Negative for cough.   Cardiovascular: Positive for chest pain. Negative for palpitations and leg swelling.  Gastrointestinal: Negative for nausea, vomiting and abdominal pain.   Genitourinary: Negative for dysuria.  Musculoskeletal: Negative for back pain and neck pain.  Skin: Negative for rash.  Neurological: Negative for syncope and light-headedness.  Psychiatric/Behavioral: The patient is not nervous/anxious.       Allergies  Review of patient's allergies indicates no known allergies.  Home Medications   Prior to Admission medications   Medication Sig Start Date End Date Taking? Authorizing Provider  cyclobenzaprine (FLEXERIL) 10 MG tablet Take 1 tablet (10 mg total) by mouth 2 (two) times daily as needed for muscle spasms. 09/25/15   Danelle Berry, PA-C  hydrochlorothiazide (HYDRODIURIL) 25 MG tablet Take 25 mg by mouth daily.    Historical Provider, MD  HYDROcodone-acetaminophen (NORCO/VICODIN) 5-325 MG tablet Take 2 tablets by mouth every 4 (four) hours as needed. 09/25/15   Danelle Berry, PA-C  ibuprofen (ADVIL,MOTRIN) 800 MG tablet Take 1 tablet (800 mg total) by mouth 3 (three) times daily. 09/25/15   Danelle Berry, PA-C   BP 179/65 mmHg  Pulse 105  Temp(Src) 98.2 F (36.8 C) (Oral)  Resp 20  Ht 6\' 1"  (1.854 m)  Wt 99.791 kg  BMI 29.03 kg/m2  SpO2 100% Physical Exam  Constitutional: He appears well-developed and well-nourished.  HENT:  Head: Normocephalic and atraumatic.  Mouth/Throat: Oropharynx is clear and moist and mucous membranes are normal. Mucous membranes are not dry.  Eyes: Conjunctivae are normal.  Neck: Trachea normal and normal range of motion. Neck supple. Normal carotid pulses and no JVD present. No muscular tenderness present. Carotid bruit is not present. No tracheal deviation present.  Cardiovascular: Normal rate, regular rhythm, S1 normal, S2 normal, normal heart sounds and intact distal pulses.  Exam reveals no distant heart sounds  and no decreased pulses.   No murmur heard. Pulmonary/Chest: Effort normal and breath sounds normal. No respiratory distress. He has no wheezes. He exhibits no tenderness.  Movement of the left arm  does not reproduce pain. Patient has occasional twitching noted of the left pectoralis. No tenderness to palpation.  Abdominal: Soft. Normal aorta and bowel sounds are normal. There is no tenderness. There is no rebound and no guarding.  Musculoskeletal: He exhibits no edema.  Neurological: He is alert.  Skin: Skin is warm and dry. He is not diaphoretic. No cyanosis. No pallor.  Psychiatric: His mood appears anxious.  Nursing note and vitals reviewed.   ED Course  Procedures (including critical care time) Labs Review Labs Reviewed  BASIC METABOLIC PANEL - Abnormal; Notable for the following:    Sodium 131 (*)    Glucose, Bld 223 (*)    Calcium 8.7 (*)    All other components within normal limits  CBC  TROPONIN I  TROPONIN I    Imaging Review Dg Chest 2 View  10/20/2015  CLINICAL DATA:  Acute onset left anterior chest pain beginning this morning. Brief shortness of breath. EXAM: CHEST  2 VIEW COMPARISON:  None. FINDINGS: The heart size and mediastinal contours are within normal limits. Both lungs are clear. No pleural effusion or pneumothorax. The visualized skeletal structures are unremarkable. IMPRESSION: Normal chest radiographs. Electronically Signed   By: Amie Portland M.D.   On: 10/20/2015 12:52   I have personally reviewed and evaluated these images and lab results as part of my medical decision-making.   EKG Interpretation   Date/Time:  Friday October 20 2015 12:02:59 EST Ventricular Rate:  107 PR Interval:  170 QRS Duration: 92 QT Interval:  334 QTC Calculation: 445 R Axis:   26 Text Interpretation:  Sinus tachycardia Incomplete right bundle branch  block Nonspecific T wave abnormality No significant change since last  tracing Confirmed by Juleen China  MD, STEPHEN ([id number]) on 10/20/2015 12:07:38 PM       12:34 PM Patient seen and examined. Symptoms atypical for ACS, but I don't have a clear alternative explanation. Patient has h/o HTN. Will perform basic cardiac work-up.  If negative, feel patient can be discharged home with appropriate follow-up and return instructions.  Vital signs reviewed and are as follows: BP 179/65 mmHg  Pulse 105  Temp(Src) 98.2 F (36.8 C) (Oral)  Resp 20  Ht 6\' 1"  (1.854 m)  Wt 99.791 kg  BMI 29.03 kg/m2  SpO2 100%  1:52 PM Pt updated. We spent some time discussing his elevated blood sugar today and need for recheck. Tylenol given for continued muscle cramping in chest. Will recheck troponin approximately 2:30 PM. Anticipate discharged home if negative.  3:10 PM second troponin negative. Will discharge to home. Patient to follow-up with PCP, referrals given.  Patient was counseled to return with severe chest pain, especially if the pain is crushing or pressure-like and spreads to the arms, back, neck, or jaw, or if they have sweating, nausea, or shortness of breath with the pain. They were encouraged to call 911 with these symptoms.   They were also told to return if their chest pain gets worse and does not go away with rest, they have an attack of chest pain lasting longer than usual despite rest and treatment with the medications their caregiver has prescribed, if they wake from sleep with chest pain or shortness of breath, if they feel dizzy or faint, if they have chest pain not typical  of their usual pain, or if they have any other emergent concerns regarding their health.  The patient verbalized understanding and agreed.     MDM   Final diagnoses:  Chest pain, unspecified chest pain type  High blood sugar   CP: Atypical story, non-ischemic EKG, troponins negative 2. Do not suspect ACS. Do not suspect PE. Vital signs are stable, mildly hypertensive at discharge.  Hyperglycemia: Without signs of ketosis. Patient needs further workup to evaluate for diabetes.   Renne CriglerJoshua Maurie Musco, PA-C 10/20/15 1511  Raeford RazorStephen Kohut, MD 11/01/15 [phone]

## 2015-10-20 NOTE — Discharge Instructions (Signed)
Please read and follow all provided instructions.  Your diagnoses today include:  1. Chest pain, unspecified chest pain type   2. High blood sugar     Tests performed today include:  An EKG of your heart   A chest x-ray  Cardiac enzymes - a blood test for heart muscle damage  Blood counts and electrolytes - high blood sugar today (220)  Vital signs. See below for your results today.   Medications prescribed:   None  Take any prescribed medications only as directed.  Follow-up instructions: Please follow-up with your primary care provider in the next 2 weeks for a recheck, and very importantly, a recheck of your blood sugar.    Return instructions:  SEEK IMMEDIATE MEDICAL ATTENTION IF:  You have severe chest pain, especially if the pain is crushing or pressure-like and spreads to the arms, back, neck, or jaw, or if you have sweating, nausea (feeling sick to your stomach), or shortness of breath. THIS IS AN EMERGENCY. Don't wait to see if the pain will go away. Get medical help at once. Call 911 or 0 (operator). DO NOT drive yourself to the hospital.   Your chest pain gets worse and does not go away with rest.   You have an attack of chest pain lasting longer than usual, despite rest and treatment with the medications your caregiver has prescribed.   You wake from sleep with chest pain or shortness of breath.  You feel dizzy or faint.  You have chest pain not typical of your usual pain for which you originally saw your caregiver.   You have any other emergent concerns regarding your health.  Additional Information: Chest pain comes from many different causes. Your caregiver has diagnosed you as having chest pain that is not specific for one problem, but does not require admission.  You are at low risk for an acute heart condition or other serious illness.   Your vital signs today were: BP 132/75 mmHg   Pulse 78   Temp(Src) 98.2 F (36.8 C) (Oral)   Resp 18   Ht 6\' 1"   (1.854 m)   Wt 99.791 kg   BMI 29.03 kg/m2   SpO2 100% If your blood pressure (BP) was elevated above 135/85 this visit, please have this repeated by your doctor within one month. --------------

## 2015-10-20 NOTE — ED Notes (Signed)
Patient and son-in-law states she gave EMS her medications and it didn't arrive with patient to room.  [facility], spoke with Nicholsindy with dispatch. EMS states the medications were leave at the apartment and the patient just brought the list of medications with her, per Froidindy.

## 2016-03-18 ENCOUNTER — Emergency Department (HOSPITAL_BASED_OUTPATIENT_CLINIC_OR_DEPARTMENT_OTHER): Payer: No Typology Code available for payment source

## 2016-03-18 ENCOUNTER — Encounter (HOSPITAL_BASED_OUTPATIENT_CLINIC_OR_DEPARTMENT_OTHER): Payer: Self-pay | Admitting: Emergency Medicine

## 2016-03-18 ENCOUNTER — Emergency Department (HOSPITAL_BASED_OUTPATIENT_CLINIC_OR_DEPARTMENT_OTHER)
Admission: EM | Admit: 2016-03-18 | Discharge: 2016-03-18 | Disposition: A | Discharge status: Home / Self Care | Payer: No Typology Code available for payment source | Attending: Emergency Medicine | Admitting: Emergency Medicine

## 2016-03-18 DIAGNOSIS — R079 Chest pain, unspecified: Secondary | ICD-10-CM

## 2016-03-18 DIAGNOSIS — M25522 Pain in left elbow: Secondary | ICD-10-CM | POA: Insufficient documentation

## 2016-03-18 DIAGNOSIS — Z791 Long term (current) use of non-steroidal anti-inflammatories (NSAID): Secondary | ICD-10-CM | POA: Insufficient documentation

## 2016-03-18 DIAGNOSIS — M25512 Pain in left shoulder: Secondary | ICD-10-CM

## 2016-03-18 DIAGNOSIS — F1290 Cannabis use, unspecified, uncomplicated: Secondary | ICD-10-CM | POA: Insufficient documentation

## 2016-03-18 DIAGNOSIS — I10 Essential (primary) hypertension: Secondary | ICD-10-CM | POA: Insufficient documentation

## 2016-03-18 DIAGNOSIS — E785 Hyperlipidemia, unspecified: Secondary | ICD-10-CM | POA: Insufficient documentation

## 2016-03-18 LAB — CBC WITH DIFFERENTIAL/PLATELET
BASOS ABS: 0 10*3/uL (ref 0.0–0.1)
Basophils Relative: 0 %
EOS ABS: 0.1 10*3/uL (ref 0.0–0.7)
Eosinophils Relative: 1 %
HEMATOCRIT: 40.3 % (ref 39.0–52.0)
Hemoglobin: 14.4 g/dL (ref 13.0–17.0)
Lymphocytes Relative: 33 %
Lymphs Abs: 2.5 10*3/uL (ref 0.7–4.0)
MCH: 31 pg (ref 26.0–34.0)
MCHC: 35.7 g/dL (ref 30.0–36.0)
MCV: 86.7 fL (ref 78.0–100.0)
MONO ABS: 0.8 10*3/uL (ref 0.1–1.0)
Monocytes Relative: 10 %
NEUTROS ABS: 4.3 10*3/uL (ref 1.7–7.7)
NEUTROS PCT: 56 %
PLATELETS: 174 10*3/uL (ref 150–400)
RBC: 4.65 MIL/uL (ref 4.22–5.81)
RDW: 11.7 % (ref 11.5–15.5)
WBC: 7.7 10*3/uL (ref 4.0–10.5)

## 2016-03-18 LAB — TROPONIN I

## 2016-03-18 LAB — COMPREHENSIVE METABOLIC PANEL
ALT: 26 U/L (ref 17–63)
ANION GAP: 7 (ref 5–15)
AST: 23 U/L (ref 15–41)
Albumin: 4.3 g/dL (ref 3.5–5.0)
Alkaline Phosphatase: 62 U/L (ref 38–126)
BILIRUBIN TOTAL: 0.9 mg/dL (ref 0.3–1.2)
BUN: 15 mg/dL (ref 6–20)
CHLORIDE: 104 mmol/L (ref 101–111)
CO2: 25 mmol/L (ref 22–32)
Calcium: 9.5 mg/dL (ref 8.9–10.3)
Creatinine, Ser: 1.01 mg/dL (ref 0.61–1.24)
Glucose, Bld: 117 mg/dL — ABNORMAL HIGH (ref 65–99)
POTASSIUM: 3.2 mmol/L — AB (ref 3.5–5.1)
Sodium: 136 mmol/L (ref 135–145)
TOTAL PROTEIN: 7.2 g/dL (ref 6.5–8.1)

## 2016-03-18 LAB — LIPASE, BLOOD: LIPASE: 31 U/L (ref 11–51)

## 2016-03-18 MED ORDER — DEXAMETHASONE 6 MG PO TABS
10.0000 mg | ORAL_TABLET | Freq: Once | ORAL | Status: AC
  Administered 2016-03-18: 10 mg via ORAL
  Filled 2016-03-18: qty 1

## 2016-03-18 MED ORDER — POTASSIUM CHLORIDE CRYS ER 20 MEQ PO TBCR
40.0000 meq | EXTENDED_RELEASE_TABLET | Freq: Once | ORAL | Status: AC
  Administered 2016-03-18: 40 meq via ORAL
  Filled 2016-03-18: qty 2

## 2016-03-18 MED ORDER — PANTOPRAZOLE SODIUM 20 MG PO TBEC: 20.0000 mg | DELAYED_RELEASE_TABLET | Freq: Every day | ORAL | Status: DC

## 2016-03-18 NOTE — ED Notes (Addendum)
Patient states that he has had some "burning" to his left shoulder and chest x 3 weeks. He recently started Acid Reflux medications and it has not helped. Patient reports that he is having burning down into his left arm and the pain in his epigastric region only cleared up 1 time with pepto.
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
## 2016-03-18 NOTE — ED Notes (Signed)
Pt A/Ox4 sitting up in bed, NAD. Pt states the burning in his L shoulder comes and goes. Right now pt is symptom free. Pt denies ShOB, nausea, or diaphoresis with his shoulder pain.

## 2016-05-08 ENCOUNTER — Encounter: Payer: Self-pay | Admitting: Neurology

## 2016-05-08 ENCOUNTER — Ambulatory Visit (INDEPENDENT_AMBULATORY_CARE_PROVIDER_SITE_OTHER): Payer: Self-pay | Admitting: Neurology

## 2016-05-08 DIAGNOSIS — R202 Paresthesia of skin: Secondary | ICD-10-CM | POA: Insufficient documentation

## 2016-05-08 DIAGNOSIS — R253 Fasciculation: Secondary | ICD-10-CM

## 2016-05-08 DIAGNOSIS — M5410 Radiculopathy, site unspecified: Secondary | ICD-10-CM

## 2016-05-08 MED ORDER — TOPIRAMATE 50 MG PO TABS: 50.0000 mg | ORAL_TABLET | Freq: Two times a day (BID) | ORAL | 2 refills | Status: DC
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
## 2016-06-02 ENCOUNTER — Ambulatory Visit
Admission: RE | Admit: 2016-06-02 | Discharge: 2016-06-02 | Disposition: A | Discharge status: Home / Self Care | Payer: PRIVATE HEALTH INSURANCE | Source: Ambulatory Visit | Attending: Neurology | Admitting: Neurology

## 2016-06-02 ENCOUNTER — Ambulatory Visit
Admission: RE | Admit: 2016-06-02 | Discharge: 2016-06-02 | Disposition: A | Discharge status: Home / Self Care | Payer: Self-pay | Source: Ambulatory Visit | Attending: Neurology | Admitting: Neurology

## 2016-06-02 DIAGNOSIS — R253 Fasciculation: Secondary | ICD-10-CM

## 2016-06-02 DIAGNOSIS — M5410 Radiculopathy, site unspecified: Secondary | ICD-10-CM

## 2016-06-02 DIAGNOSIS — R202 Paresthesia of skin: Secondary | ICD-10-CM

## 2016-06-06 ENCOUNTER — Ambulatory Visit (INDEPENDENT_AMBULATORY_CARE_PROVIDER_SITE_OTHER): Payer: Self-pay | Admitting: Diagnostic Neuroimaging

## 2016-06-06 ENCOUNTER — Telehealth: Payer: Self-pay

## 2016-06-06 ENCOUNTER — Encounter (INDEPENDENT_AMBULATORY_CARE_PROVIDER_SITE_OTHER): Payer: Self-pay | Admitting: Diagnostic Neuroimaging

## 2016-06-06 DIAGNOSIS — Z0289 Encounter for other administrative examinations: Secondary | ICD-10-CM

## 2016-06-06 DIAGNOSIS — R202 Paresthesia of skin: Secondary | ICD-10-CM

## 2016-06-06 NOTE — Telephone Encounter (Signed)
I called the patient and gave him results of MRI scan of the cervical and lumbar spine both showing mild disc degenerative changes and foraminal narrowing but no major compression for which she needs any surgical intervention and the current time. I advised him to start taking Topamax as advice and do back stretching exercises. He voiced understanding.

## 2016-06-06 NOTE — Telephone Encounter (Signed)
Patient wants results of MRi spine and lumbar please call  [phone] [facility]

## 2016-06-06 NOTE — Procedures (Signed)
   [facility]  NCS (NERVE CONDUCTION STUDY) WITH EMG (ELECTROMYOGRAPHY) REPORT   STUDY DATE: 06/06/16 PATIENT NAME: Timothy Sherman DOB: 1970-10-28 MRN: [id number]  ORDERING CLINICIAN: Delia HeadyPramod Sethi, MD   TECHNOLOGIST: Gearldine ShownLorraine Jones  ELECTROMYOGRAPHER: Glenford BayleyVikram R. Meziah Blasingame, MD  CLINICAL INFORMATION: 45 year old male with left lower extremity numbness and pain.  FINDINGS: NERVE CONDUCTION STUDY: Bilateral peroneal and tibial motor responses and F wave latencies are normal. Bilateral H reflex responses are normal. Bilateral peroneal sensory responses are normal.   NEEDLE ELECTROMYOGRAPHY: Needle examination of left lower extremity vastus medialis, tibialis anterior and gastrocnemius muscles is normal. Due to patient anxiety and intolerance of test, lumbar paraspinal muscles were deferred.   IMPRESSION:  This is a normal study. No electrodiagnostic evidence of large fiber neuropathy or left lumbar radiculopathy at this time.    INTERPRETING PHYSICIAN:  Suanne MarkerVIKRAM R. Saurabh Hettich, MD Certified in Neurology, Neurophysiology and Neuroimaging  [facility] [address] [place], [place] [id number] [phone]

## 2016-07-19 ENCOUNTER — Encounter: Payer: Self-pay | Admitting: Neurology

## 2016-07-19 ENCOUNTER — Ambulatory Visit: Payer: Self-pay | Admitting: Neurology

## 2016-10-16 ENCOUNTER — Encounter (HOSPITAL_BASED_OUTPATIENT_CLINIC_OR_DEPARTMENT_OTHER): Payer: Self-pay

## 2016-10-16 ENCOUNTER — Emergency Department (HOSPITAL_BASED_OUTPATIENT_CLINIC_OR_DEPARTMENT_OTHER)
Admission: EM | Admit: 2016-10-16 | Discharge: 2016-10-16 | Disposition: A | Discharge status: Home / Self Care | Payer: PRIVATE HEALTH INSURANCE | Attending: Emergency Medicine | Admitting: Emergency Medicine

## 2016-10-16 DIAGNOSIS — M6281 Muscle weakness (generalized): Secondary | ICD-10-CM | POA: Insufficient documentation

## 2016-10-16 DIAGNOSIS — Z87891 Personal history of nicotine dependence: Secondary | ICD-10-CM | POA: Insufficient documentation

## 2016-10-16 DIAGNOSIS — R531 Weakness: Secondary | ICD-10-CM

## 2016-10-16 DIAGNOSIS — R110 Nausea: Secondary | ICD-10-CM | POA: Insufficient documentation

## 2016-10-16 DIAGNOSIS — I10 Essential (primary) hypertension: Secondary | ICD-10-CM | POA: Insufficient documentation

## 2016-10-16 HISTORY — DX: Hypokalemia: E87.6

## 2016-10-16 LAB — URINALYSIS, ROUTINE W REFLEX MICROSCOPIC
BILIRUBIN URINE: NEGATIVE
Glucose, UA: NEGATIVE mg/dL
Hgb urine dipstick: NEGATIVE
KETONES UR: NEGATIVE mg/dL
LEUKOCYTES UA: NEGATIVE
NITRITE: NEGATIVE
PROTEIN: NEGATIVE mg/dL
Specific Gravity, Urine: 1.01 (ref 1.005–1.030)
pH: 6.5 (ref 5.0–8.0)

## 2016-10-16 LAB — CBC
HCT: 44.3 % (ref 39.0–52.0)
Hemoglobin: 15.6 g/dL (ref 13.0–17.0)
MCH: 30.2 pg (ref 26.0–34.0)
MCHC: 35.2 g/dL (ref 30.0–36.0)
MCV: 85.9 fL (ref 78.0–100.0)
PLATELETS: 188 10*3/uL (ref 150–400)
RBC: 5.16 MIL/uL (ref 4.22–5.81)
RDW: 12 % (ref 11.5–15.5)
WBC: 5.7 10*3/uL (ref 4.0–10.5)

## 2016-10-16 LAB — BASIC METABOLIC PANEL
Anion gap: 7 (ref 5–15)
BUN: 13 mg/dL (ref 6–20)
CALCIUM: 9.7 mg/dL (ref 8.9–10.3)
CO2: 26 mmol/L (ref 22–32)
CREATININE: 1.25 mg/dL — AB (ref 0.61–1.24)
Chloride: 103 mmol/L (ref 101–111)
GFR calc Af Amer: >60 mL/min (ref >60)
GFR calc non Af Amer: >60 mL/min (ref >60)
Glucose, Bld: 115 mg/dL — ABNORMAL HIGH (ref 65–99)
Potassium: 4.1 mmol/L (ref 3.5–5.1)
SODIUM: 136 mmol/L (ref 135–145)

## 2016-10-16 MED ORDER — SODIUM CHLORIDE 0.9 % IV BOLUS (SEPSIS)
1000.0000 mL | Freq: Once | INTRAVENOUS | Status: AC
  Administered 2016-10-16: 1000 mL via INTRAVENOUS

## 2016-10-16 NOTE — ED Notes (Signed)
Patient reports that he has had similar feelings when his potassium is low.

## 2016-10-16 NOTE — ED Provider Notes (Signed)
[facility] Provider Note   CSN: [id number] Arrival date & time: 10/16/16  1340     History   Chief Complaint Chief Complaint  Patient presents with  . Weakness    HPI Timothy Sherman is a 46 y.o. male.  Patients with no significant past medical history presents with complaint of intermittent fevers, fatigue, nausea without vomiting, decreased appetite over the past 1 week. Patient denies night sweats, ear pain, runny nose, sore throat. No chest pain or abdominal pain. No urinary symptoms. Patient states that he felt similarly in the past when his potassium was low. He states that he is also concerned that his symptoms are related to colon cancer as he had a brother who passed from this disease. Onset of symptoms acute.       Past Medical History:  Diagnosis Date  . Hyperlipidemia   . Hypertension   . Hypokalemia   . Panic attacks     Patient Active Problem List   Diagnosis Date Noted  . Left leg paresthesias 05/08/2016  . Back pain with left-sided radiculopathy 05/08/2016  . Fasciculations 05/08/2016    Past Surgical History:  Procedure Laterality Date  . HERNIA REPAIR         Home Medications    Prior to Admission medications   Medication Sig Start Date End Date Taking? Authorizing Provider  citalopram (CELEXA) 20 MG tablet Take 20 mg by mouth daily.    Historical Provider, MD  gabapentin (NEURONTIN) 300 MG capsule Take 300 mg by mouth 3 (three) times daily.    Historical Provider, MD  meloxicam (MOBIC) 7.5 MG tablet Take 7.5 mg by mouth daily.    Historical Provider, MD  pantoprazole (PROTONIX) 20 MG tablet Take 1 tablet (20 mg total) by mouth daily. 03/18/16   Alvira MondayErin Schlossman, MD    Family History No family history on file.  Social History Social History  Substance Use Topics  . Smoking status: Former Smoker    Packs/day: 0.50  . Smokeless tobacco: Never Used  . Alcohol use No     Allergies   Patient has no known  allergies.   Review of Systems Review of Systems  Constitutional: Positive for appetite change, chills, fatigue and fever.  HENT: Negative for congestion, ear pain, rhinorrhea, sinus pressure and sore throat.   Eyes: Negative for redness.  Respiratory: Negative for cough and wheezing.   Gastrointestinal: Positive for nausea. Negative for abdominal pain, diarrhea and vomiting.  Genitourinary: Negative for dysuria.  Musculoskeletal: Positive for myalgias. Negative for neck stiffness.  Skin: Negative for rash.  Neurological: Negative for headaches.  Hematological: Negative for adenopathy.     Physical Exam Updated Vital Signs BP (!) 140/109 (BP Location: Right Arm)   Pulse 95   Temp 98.5 F (36.9 C) (Oral)   Resp 22   SpO2 95%   Physical Exam  Constitutional: He appears well-developed and well-nourished.  HENT:  Head: Normocephalic and atraumatic.  Right Ear: Tympanic membrane, external ear and ear canal normal.  Left Ear: Tympanic membrane, external ear and ear canal normal.  Nose: Nose normal. No mucosal edema or rhinorrhea.  Mouth/Throat: Uvula is midline, oropharynx is clear and moist and mucous membranes are normal. Mucous membranes are not dry. No trismus in the jaw. No uvula swelling. No oropharyngeal exudate, posterior oropharyngeal edema, posterior oropharyngeal erythema or tonsillar abscesses.  Eyes: Conjunctivae are normal. Right eye exhibits no discharge. Left eye exhibits no discharge.  Neck: Normal range of motion. Neck supple.  Cardiovascular: Normal rate, regular rhythm and normal heart sounds.   Pulmonary/Chest: Effort normal and breath sounds normal. No respiratory distress. He has no wheezes. He has no rales.  Abdominal: Soft. There is no tenderness.  Neurological: He is alert.  Skin: Skin is warm and dry.  Psychiatric: He has a normal mood and affect.  Nursing note and vitals reviewed.    ED Treatments / Results  Labs (all labs ordered are listed, but  only abnormal results are displayed) Labs Reviewed  BASIC METABOLIC PANEL - Abnormal; Notable for the following:       Result Value   Glucose, Bld 115 (*)    Creatinine, Ser 1.25 (*)    All other components within normal limits  CBC  URINALYSIS, ROUTINE W REFLEX MICROSCOPIC    Procedures Procedures (including critical care time)  Medications Ordered in ED Medications  sodium chloride 0.9 % bolus 1,000 mL (1,000 mLs Intravenous New Bag/Given 10/16/16 1527)     Initial Impression / Assessment and Plan / ED Course  I have reviewed the triage vital signs and the nursing notes.  Pertinent labs & imaging results that were available during my care of the patient were reviewed by me and considered in my medical decision making (see chart for details).  Clinical Course    Patient seen and examined. Informed of neg labs and normal K. Pending UA, IV fluids.   Encouraged PCP f/u regarding surveillance for colon CA screening.   Vital signs reviewed and are as follows: BP (!) 140/109 (BP Location: Right Arm)   Pulse 95   Temp 98.5 F (36.9 C) (Oral)   Resp 22   SpO2 95%   4:50 PM patient doing well. Informed of all results. He will follow-up with his primary care physician if he does not feel better in one week. Return to the emergency department with any other concerns or worsening symptoms. Patient verbalizes understanding and agrees with plan. Will discharge to home. Encouraged rest, hydration, healthy eating.   Final Clinical Impressions(s) / ED Diagnoses   Final diagnoses:  Generalized weakness   Patient with generalized weakness after what sounds like an upper respiratory infection or possibly influenza. These symptoms are improving. No fever in emergency department today. Vital signs are reassuring. Labs without significant abnormality. Patient was given 1 L of IV fluids and feels better. No indications for admission at this time.  New Prescriptions New Prescriptions   No  medications on file     Renne Crigler, PA-C 10/16/16 1651    Jacalyn Lefevre, MD 10/17/16 [phone]

## 2016-10-16 NOTE — Discharge Instructions (Signed)
Please read and follow all provided instructions.  Your diagnoses today include:  1. Generalized weakness     Tests performed today include:  Blood counts and electrolytes - normal potassium;  Urine test - no infection  Vital signs. See below for your results today.   Medications prescribed:   None  Take any prescribed medications only as directed.  Home care instructions:  Follow any educational materials contained in this packet.  BE VERY CAREFUL not to take multiple medicines containing Tylenol (also called acetaminophen). Doing so can lead to an overdose which can damage your liver and cause liver failure and possibly death.   Follow-up instructions: Please follow-up with your primary care provider in the next 3 days for further evaluation of your symptoms.   Return instructions:   Please return to the Emergency Department if you experience worsening symptoms.   Please return if you have any other emergent concerns.  Additional Information:  Your vital signs today were: BP 149/74    Pulse 74    Temp 98.5 F (36.9 C) (Oral)    Resp 21    SpO2 100%  If your blood pressure (BP) was elevated above 135/85 this visit, please have this repeated by your doctor within one month. --------------

## 2016-10-16 NOTE — ED Triage Notes (Signed)
C/o weakness, fever, nausea, weight loss x 7 days-denies v/d-NAD-steady gait

## 2017-01-21 ENCOUNTER — Encounter (HOSPITAL_BASED_OUTPATIENT_CLINIC_OR_DEPARTMENT_OTHER): Payer: Self-pay | Admitting: *Deleted

## 2017-01-21 ENCOUNTER — Emergency Department (HOSPITAL_BASED_OUTPATIENT_CLINIC_OR_DEPARTMENT_OTHER)
Admission: EM | Admit: 2017-01-21 | Discharge: 2017-01-21 | Disposition: A | Discharge status: Home / Self Care | Payer: PRIVATE HEALTH INSURANCE | Attending: Emergency Medicine | Admitting: Emergency Medicine

## 2017-01-21 DIAGNOSIS — T18128A Food in esophagus causing other injury, initial encounter: Secondary | ICD-10-CM | POA: Insufficient documentation

## 2017-01-21 DIAGNOSIS — X58XXXA Exposure to other specified factors, initial encounter: Secondary | ICD-10-CM | POA: Insufficient documentation

## 2017-01-21 DIAGNOSIS — Y999 Unspecified external cause status: Secondary | ICD-10-CM | POA: Insufficient documentation

## 2017-01-21 DIAGNOSIS — R1319 Other dysphagia: Secondary | ICD-10-CM

## 2017-01-21 DIAGNOSIS — I10 Essential (primary) hypertension: Secondary | ICD-10-CM | POA: Insufficient documentation

## 2017-01-21 DIAGNOSIS — K222 Esophageal obstruction: Secondary | ICD-10-CM

## 2017-01-21 DIAGNOSIS — Y939 Activity, unspecified: Secondary | ICD-10-CM | POA: Insufficient documentation

## 2017-01-21 DIAGNOSIS — R1310 Dysphagia, unspecified: Secondary | ICD-10-CM

## 2017-01-21 DIAGNOSIS — Z79899 Other long term (current) drug therapy: Secondary | ICD-10-CM | POA: Insufficient documentation

## 2017-01-21 DIAGNOSIS — Y929 Unspecified place or not applicable: Secondary | ICD-10-CM | POA: Insufficient documentation

## 2017-01-21 DIAGNOSIS — Z87891 Personal history of nicotine dependence: Secondary | ICD-10-CM | POA: Insufficient documentation

## 2017-01-21 MED ORDER — PANTOPRAZOLE SODIUM 40 MG IV SOLR: 40.0000 mg | Freq: Once | INTRAVENOUS | Status: DC

## 2017-01-21 MED ORDER — GLUCAGON HCL RDNA (DIAGNOSTIC) 1 MG IJ SOLR
1.0000 mg | Freq: Once | INTRAMUSCULAR | Status: AC
  Administered 2017-01-21: 1 mg via INTRAMUSCULAR
  Filled 2017-01-21: qty 1

## 2017-01-21 MED ORDER — RANITIDINE HCL 300 MG PO TABS: 300.0000 mg | ORAL_TABLET | Freq: Every day | ORAL | 0 refills | Status: AC

## 2017-01-21 MED ORDER — ONDANSETRON 8 MG PO TBDP
8.0000 mg | ORAL_TABLET | Freq: Once | ORAL | Status: AC
  Administered 2017-01-21: 8 mg via ORAL
  Filled 2017-01-21: qty 1

## 2017-01-21 MED FILL — raNITIdine HCL 300 MG TABS: 300 | 30 days supply | Qty: 30 | Fill #0

## 2017-01-21 NOTE — ED Notes (Signed)
ED Provider at bedside. Pt given water and saltines per VORB from Dr. Bebe Shaggy

## 2017-01-21 NOTE — ED Notes (Signed)
Pt attempting to self-induce vomiting which he states help to alleviate the symptoms

## 2017-01-21 NOTE — ED Notes (Signed)
Pt tolerated PO challenge without difficulty. EDP notified

## 2017-01-21 NOTE — ED Notes (Signed)
Pt directed to pharmacy to pick up Rx 

## 2017-01-21 NOTE — ED Triage Notes (Signed)
Pt reports difficulty swallowing since 0200 am after peanut butter/jelly sandwich and then taking a dose of ibuprofen. Reports he has been treated for acid reflux in the past when he has had similar sx but it didn't help

## 2017-01-21 NOTE — ED Provider Notes (Signed)
[facility]   CSN: [id number] Arrival date & time: 01/21/17  4098     History   Chief Complaint Chief Complaint  Patient presents with  . Dysphagia    HPI Timothy Sherman is a 46 y.o. male.  The history is provided by the patient.  Patient with h/o hypertension presents with difficulty swallowing He reports at 2am he ate a peanut butter/jelly sandwich followed by ibuprofen, and since that time he has had difficulty swallowing.  He reports he is unable to swallow saliva.  He reports chest burning. He had felt prior to eating at 2am He ate dinner last night without issue He reports his course is worsening It is relieved by self induced vomiting It is worsened with swallowing He reports sore throat He denies difficulty breathing No h/o allergic reactions   Past Medical History:  Diagnosis Date  . Hyperlipidemia   . Hypertension   . Hypokalemia   . Panic attacks     Patient Active Problem List   Diagnosis Date Noted  . Left leg paresthesias 05/08/2016  . Back pain with left-sided radiculopathy 05/08/2016  . Fasciculations 05/08/2016    Past Surgical History:  Procedure Laterality Date  . HERNIA REPAIR         Home Medications    Prior to Admission medications   Medication Sig Start Date End Date Taking? Authorizing Provider  citalopram (CELEXA) 20 MG tablet Take 20 mg by mouth daily.    Historical Provider, MD  gabapentin (NEURONTIN) 300 MG capsule Take 300 mg by mouth 3 (three) times daily.    Historical Provider, MD  meloxicam (MOBIC) 7.5 MG tablet Take 7.5 mg by mouth daily.    Historical Provider, MD  pantoprazole (PROTONIX) 20 MG tablet Take 1 tablet (20 mg total) by mouth daily. 03/18/16   Alvira Monday, MD    Family History No family history on file.  Social History Social History  Substance Use Topics  . Smoking status: Former Smoker    Packs/day: 0.50  . Smokeless tobacco: Never Used  . Alcohol use Yes   Comment: weekends, occasional     Allergies   Patient has no known allergies.   Review of Systems Review of Systems  Constitutional: Negative for fever.  HENT: Positive for trouble swallowing. Negative for facial swelling.   Respiratory: Negative for shortness of breath.   Cardiovascular:       Chest burning   Gastrointestinal: Positive for vomiting.  Neurological: Positive for headaches.       Occasional headache  All other systems reviewed and are negative.    Physical Exam Updated Vital Signs BP (!) 144/101 (BP Location: Left Arm)   Pulse 95   Temp 98.6 F (37 C) (Oral)   Resp 18   Ht  (1.854 m)   Wt 99.8 kg   SpO2 100%   BMI 29.03 kg/m   Physical Exam CONSTITUTIONAL: Well developed/well nourished, pt self inducing vomiting when I enter room HEAD: Normocephalic/atraumatic EYES: EOMI/PERRL ENMT: Mucous membranes moist, uvula midline with mild erythema, no edema.  No lip/tongue swelling is noted NECK: supple no meningeal signs SPINE/BACK:entire spine nontender CV: S1/S2 noted, no murmurs/rubs/gallops noted LUNGS: Lungs are clear to auscultation bilaterally, no apparent distress ABDOMEN: soft, nontender NEURO: Pt is awake/alert/appropriate, moves all extremitiesx4.  No facial droop.   EXTREMITIES: full ROM SKIN: warm, color normal PSYCH: no abnormalities of mood noted, alert and oriented to situation   ED Treatments / Results  Labs (  all labs ordered are listed, but only abnormal results are displayed) Labs Reviewed - No data to display  EKG  EKG Interpretation None       Radiology No results found.  Procedures Procedures (including critical care time)  Medications Ordered in ED Medications  pantoprazole (PROTONIX) injection 40 mg (0 mg Intravenous Hold 01/21/17 0820)  ondansetron (ZOFRAN-ODT) disintegrating tablet 8 mg (8 mg Oral Given 01/21/17 0752)  glucagon (human recombinant) (GLUCAGEN) injection 1 mg (1 mg Intramuscular Given 01/21/17  0751)     Initial Impression / Assessment and Plan / ED Course  I have reviewed the triage vital signs and the nursing notes.      7:42 AM Pt with symptoms of food impaction meds ordered 8:31 AM Pt improved He is now able to sip water 9:15 AM Pt improved He is now drinking water and eating crackers He reports zantac has worked previously I discussed need for f/u as outpatient with GI   Final Clinical Impressions(s) / ED Diagnoses   Final diagnoses:  Esophageal obstruction due to food impaction  Esophageal dysphagia    New Prescriptions New Prescriptions   RANITIDINE (ZANTAC) 300 MG TABLET    Take 1 tablet (300 mg total) by mouth at bedtime.     Zadie Rhine, MD 01/21/17 [phone]

## 2017-01-21 NOTE — ED Notes (Signed)
ED Provider at bedside. 

## 2017-02-26 IMAGING — CR DG CHEST 2V
2 series · 2 of 2 positions shown · non-contrast
Comparison: None.

CLINICAL DATA: Acute onset left anterior chest pain beginning this
morning. Brief shortness of breath.

EXAM:
CHEST  2 VIEW

[w chest pa]
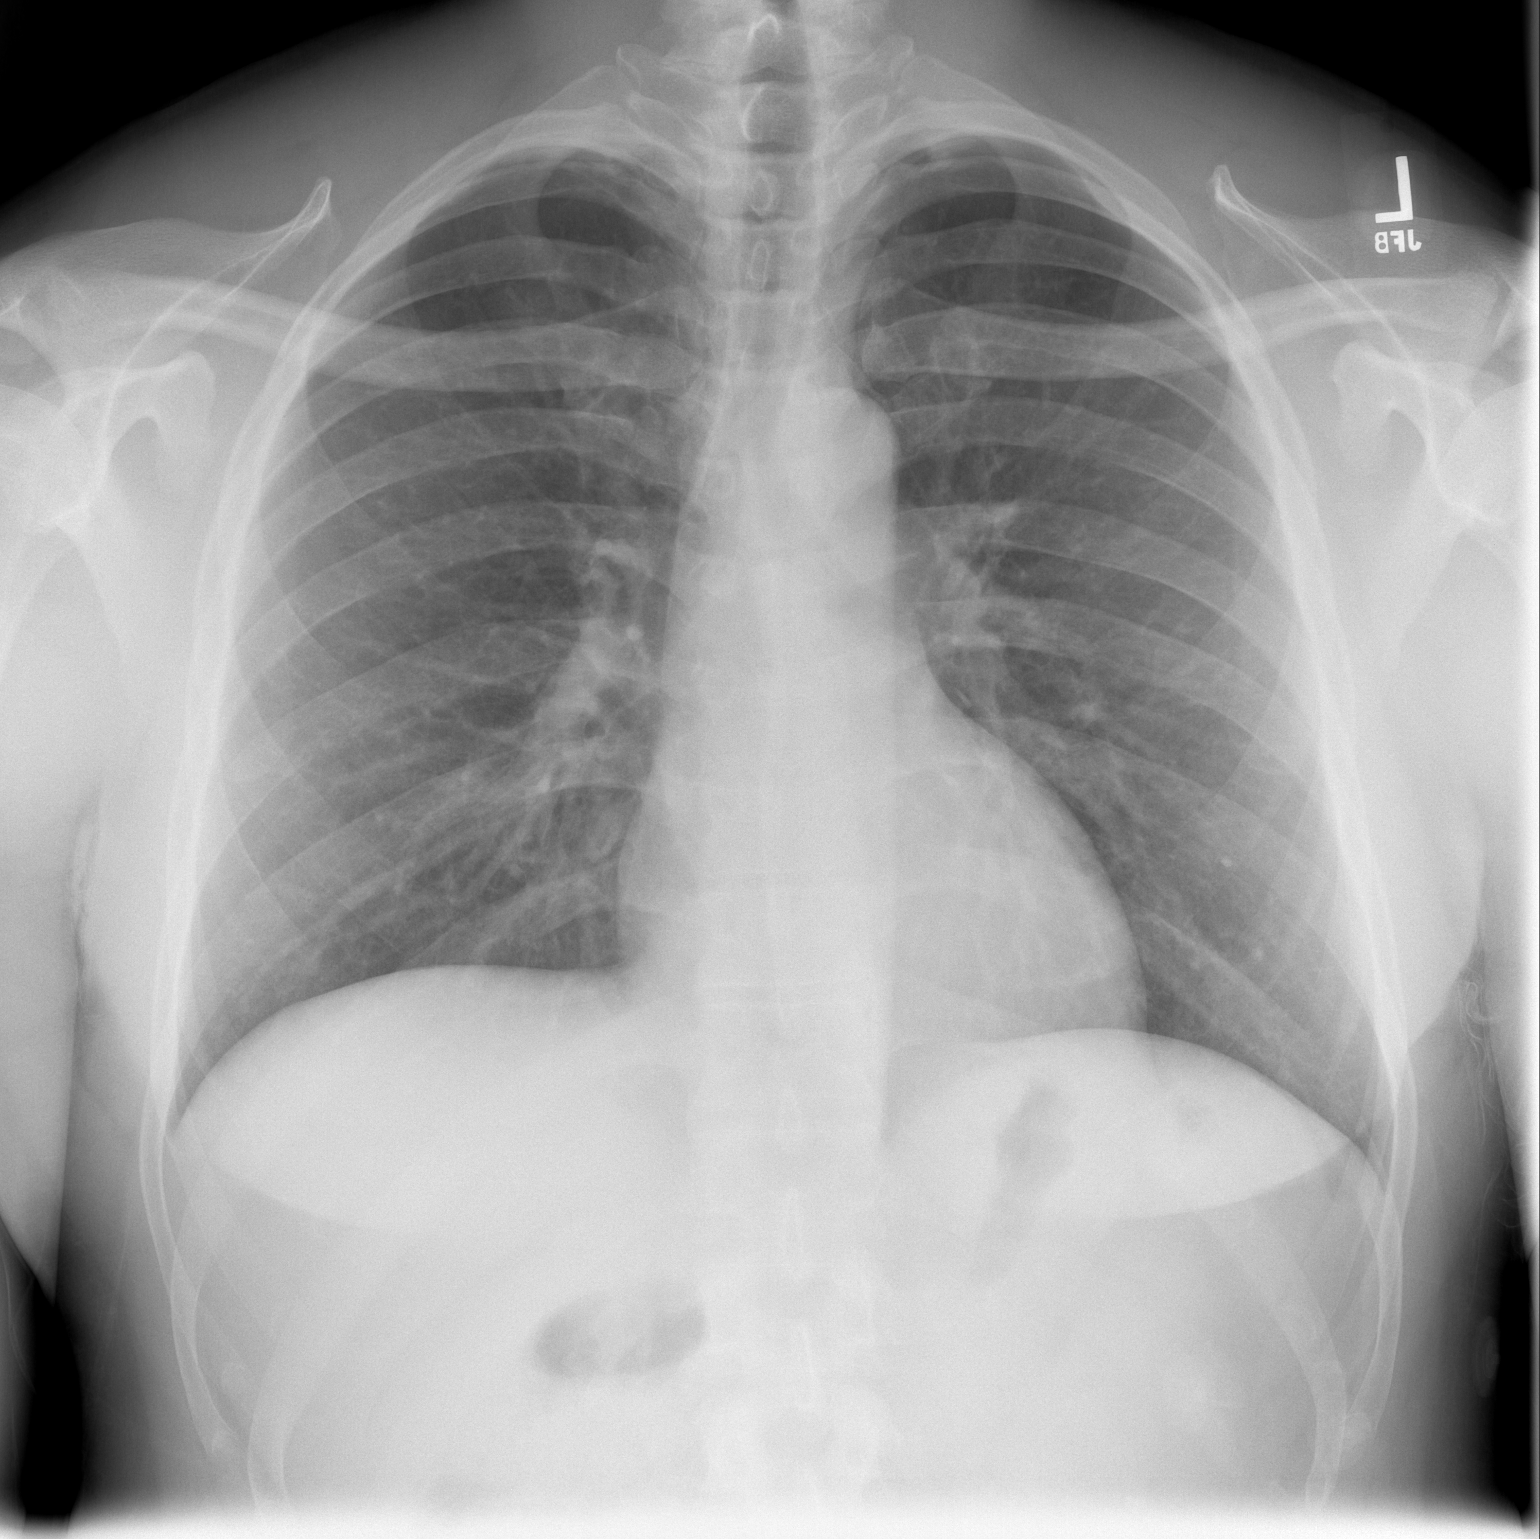

[w chest lat]
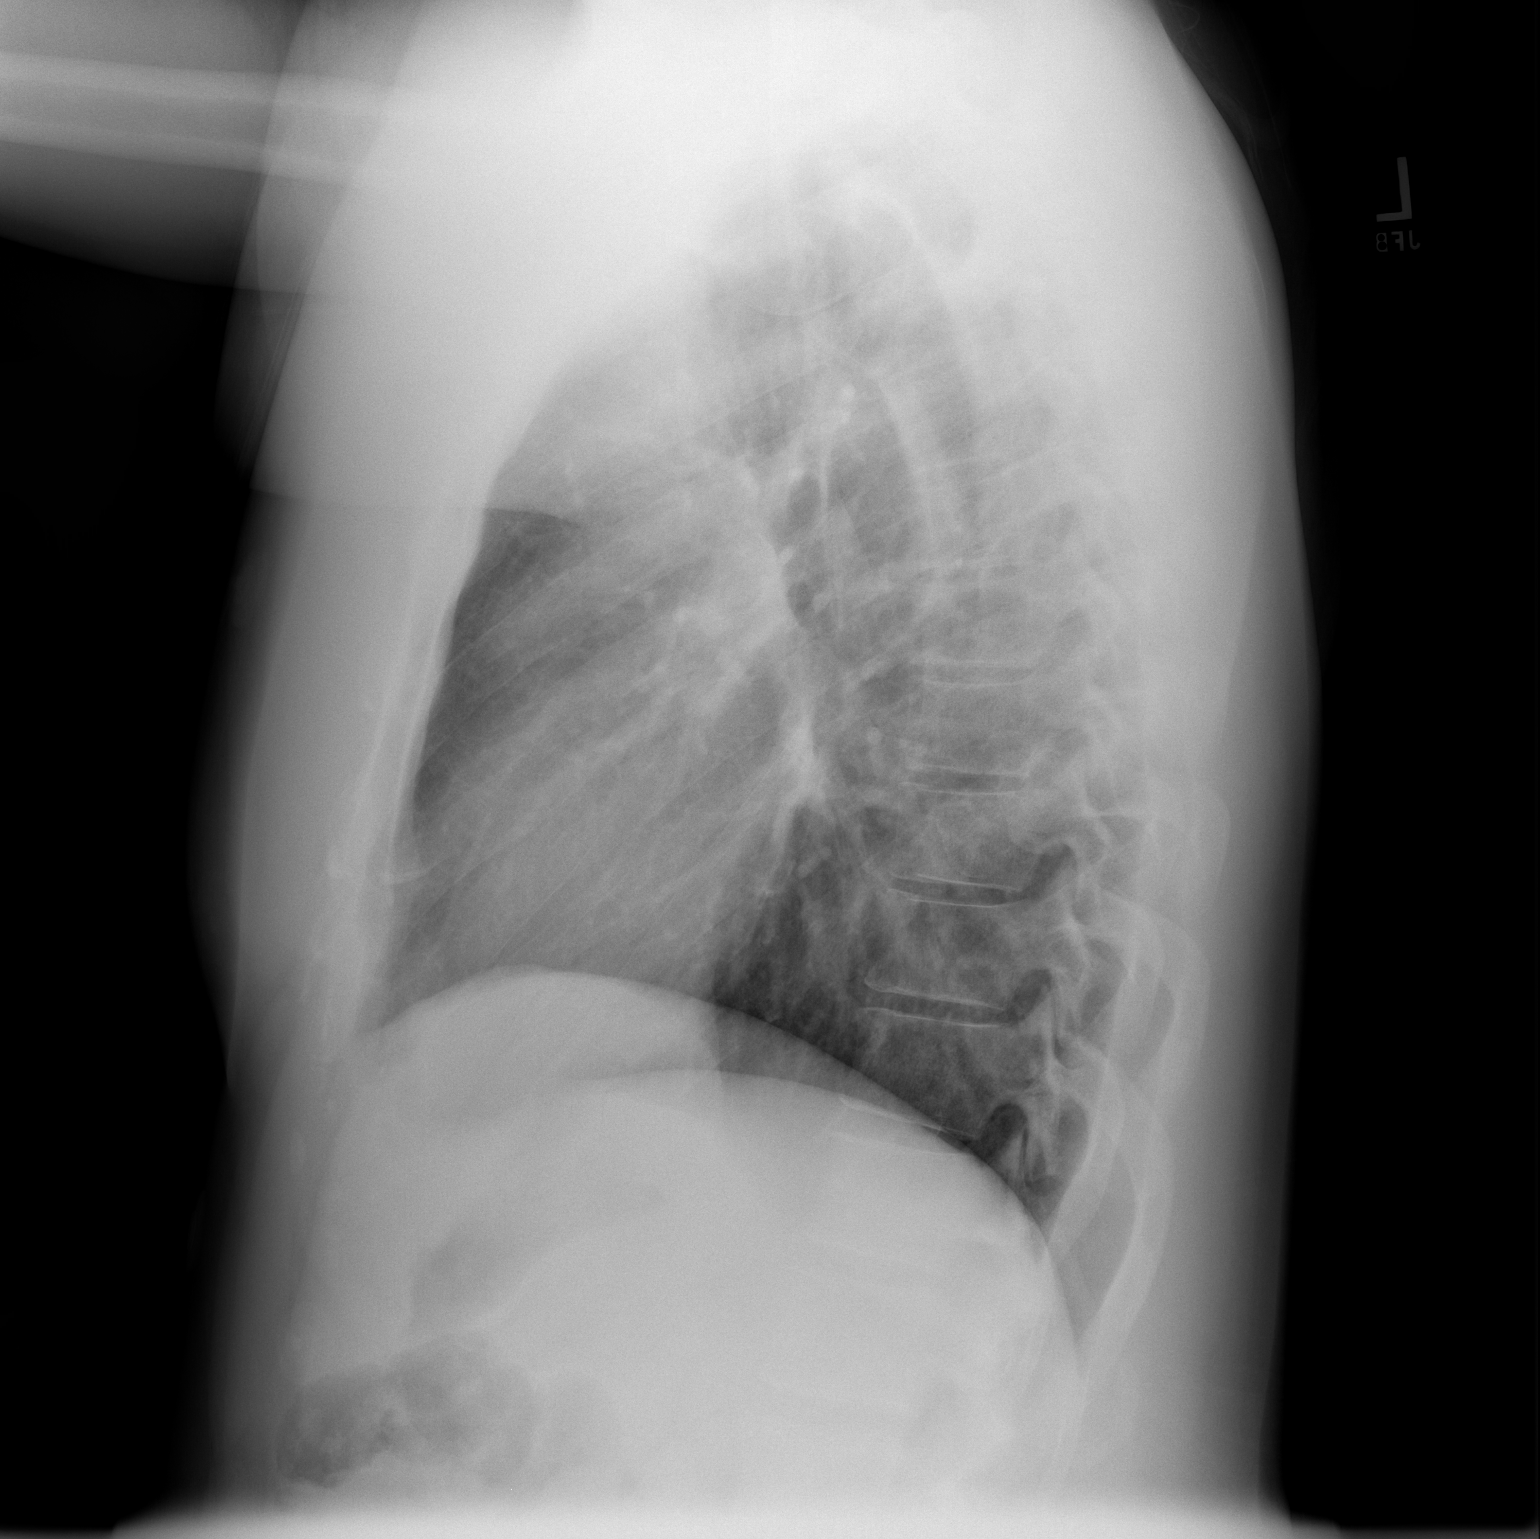

[2 of 2 positions shown; findings below may reference images not displayed]

FINDINGS: The heart size and mediastinal contours are within normal limits.
Both lungs are clear. No pleural effusion or pneumothorax. The
visualized skeletal structures are unremarkable.
IMPRESSION: Normal chest radiographs.

## 2017-07-19 ENCOUNTER — Encounter (HOSPITAL_BASED_OUTPATIENT_CLINIC_OR_DEPARTMENT_OTHER): Payer: Self-pay | Admitting: Emergency Medicine

## 2017-07-19 ENCOUNTER — Emergency Department (HOSPITAL_BASED_OUTPATIENT_CLINIC_OR_DEPARTMENT_OTHER)
Admission: EM | Admit: 2017-07-19 | Discharge: 2017-07-19 | Disposition: A | Discharge status: Home / Self Care | Payer: Self-pay | Attending: Emergency Medicine | Admitting: Emergency Medicine

## 2017-07-19 DIAGNOSIS — Z87891 Personal history of nicotine dependence: Secondary | ICD-10-CM | POA: Insufficient documentation

## 2017-07-19 DIAGNOSIS — Z79899 Other long term (current) drug therapy: Secondary | ICD-10-CM | POA: Insufficient documentation

## 2017-07-19 DIAGNOSIS — I10 Essential (primary) hypertension: Secondary | ICD-10-CM | POA: Insufficient documentation

## 2017-07-19 DIAGNOSIS — R5383 Other fatigue: Secondary | ICD-10-CM | POA: Insufficient documentation

## 2017-07-19 LAB — CBC WITH DIFFERENTIAL/PLATELET
BASOS ABS: 0 10*3/uL (ref 0.0–0.1)
BASOS PCT: 1 %
EOS ABS: 0.2 10*3/uL (ref 0.0–0.7)
EOS PCT: 3 %
HCT: 40.2 % (ref 39.0–52.0)
Hemoglobin: 14.1 g/dL (ref 13.0–17.0)
LYMPHS PCT: 43 %
Lymphs Abs: 2.8 10*3/uL (ref 0.7–4.0)
MCH: 30.7 pg (ref 26.0–34.0)
MCHC: 35.1 g/dL (ref 30.0–36.0)
MCV: 87.4 fL (ref 78.0–100.0)
MONO ABS: 0.6 10*3/uL (ref 0.1–1.0)
Monocytes Relative: 10 %
Neutro Abs: 2.8 10*3/uL (ref 1.7–7.7)
Neutrophils Relative %: 44 %
PLATELETS: 203 10*3/uL (ref 150–400)
RBC: 4.6 MIL/uL (ref 4.22–5.81)
RDW: 11.8 % (ref 11.5–15.5)
WBC: 6.4 10*3/uL (ref 4.0–10.5)

## 2017-07-19 LAB — BASIC METABOLIC PANEL
Anion gap: 6 (ref 5–15)
BUN: 12 mg/dL (ref 6–20)
CO2: 24 mmol/L (ref 22–32)
CREATININE: 1 mg/dL (ref 0.61–1.24)
Calcium: 9.3 mg/dL (ref 8.9–10.3)
Chloride: 106 mmol/L (ref 101–111)
GFR calc Af Amer: >60 mL/min (ref >60)
GLUCOSE: 94 mg/dL (ref 65–99)
POTASSIUM: 4 mmol/L (ref 3.5–5.1)
SODIUM: 136 mmol/L (ref 135–145)

## 2017-07-19 MED ORDER — SODIUM CHLORIDE 0.9 % IV BOLUS (SEPSIS)
1000.0000 mL | Freq: Once | INTRAVENOUS | Status: AC
  Administered 2017-07-19: 1000 mL via INTRAVENOUS

## 2017-07-19 NOTE — ED Triage Notes (Signed)
Patient states that he is having pain and numbness to his lower leg and now to his let pinky finger. Reports that he has had this for "years" patient also reports that he is having generalized fatigue as well as a increase in his "acid reflux" rubbing his epigastric region. Patient denies any chest pain or SOB at the begging of the triage  - the patient as this RN is triaging him gives more and more problems that he would like looked at. Patieint reports that he is having problems swallowing  - and when completing the triage he grabs his chest and states that it just started hurting

## 2017-07-19 NOTE — ED Notes (Signed)
Pt given d/c instructions as per chart. Verbalizes understanding. No questions. 
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
## 2017-07-26 IMAGING — CR DG CHEST 2V
2 series · 2 of 2 positions shown · non-contrast
Comparison: Chest radiograph performed 10/20/2015

CLINICAL DATA: Acute onset of generalized chest pain radiating to
the left arm. Initial encounter.

EXAM:
CHEST  2 VIEW

[w chest pa]
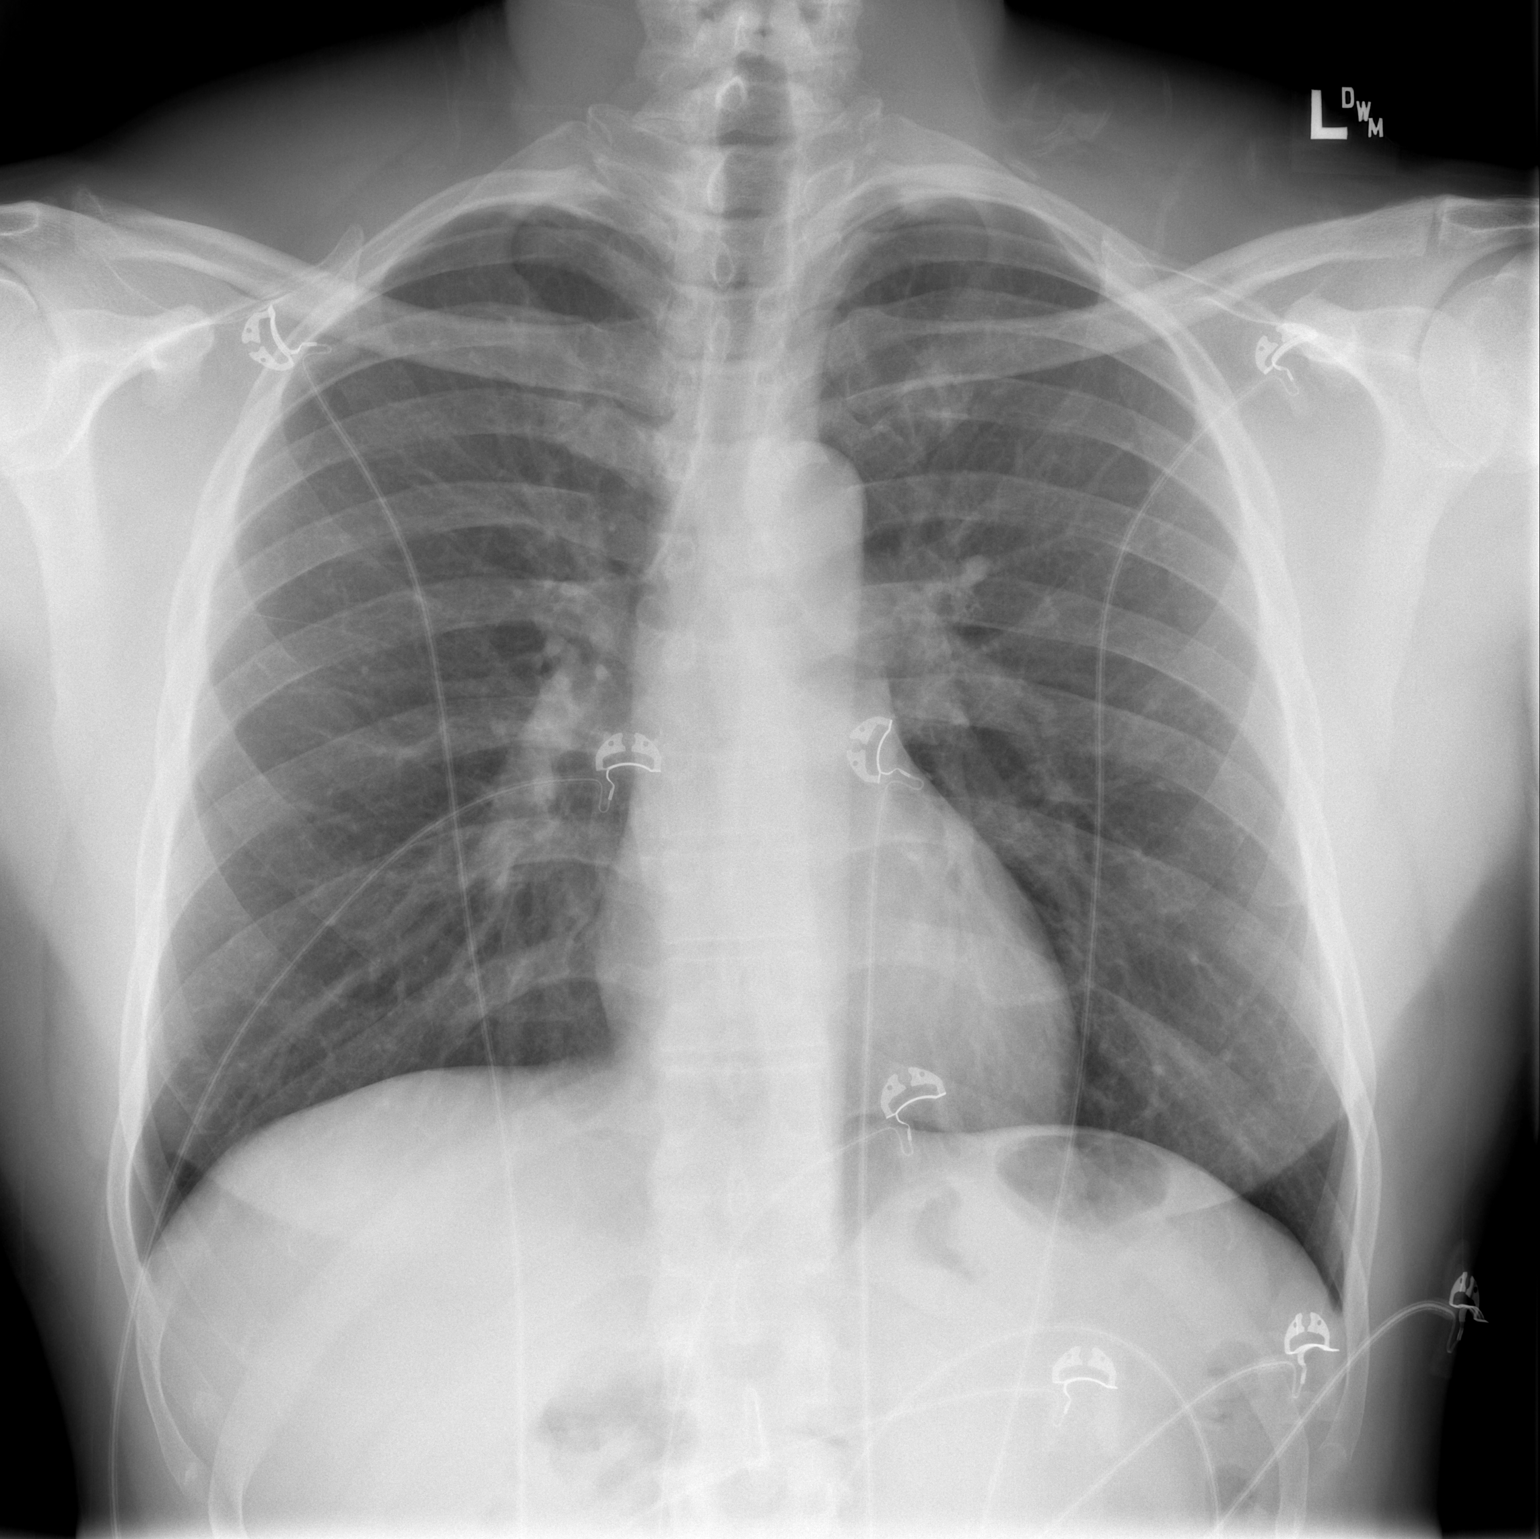

[w chest lat]
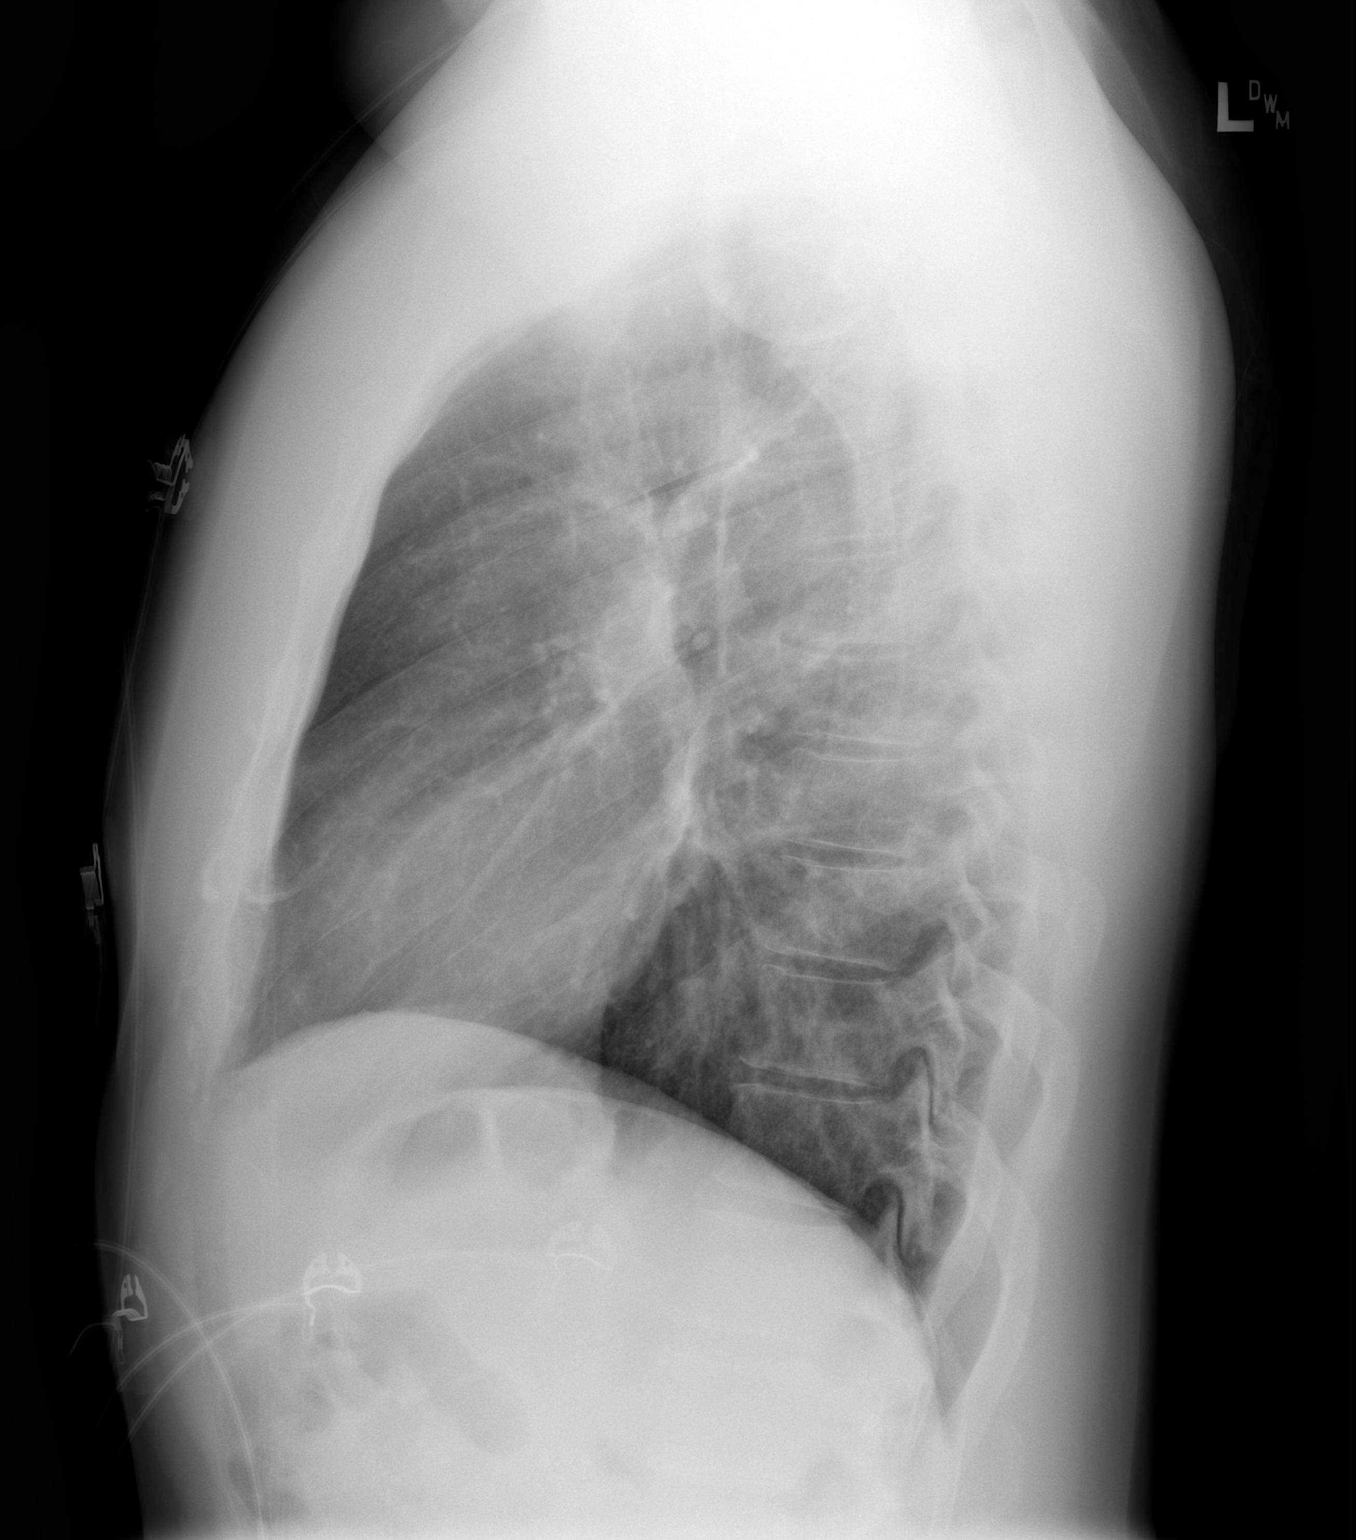

[2 of 2 positions shown; findings below may reference images not displayed]

FINDINGS: The lungs are well-aerated and clear. There is no evidence of focal
opacification, pleural effusion or pneumothorax.

The heart is normal in size; the mediastinal contour is within
normal limits. No acute osseous abnormalities are seen.
IMPRESSION: No acute cardiopulmonary process seen.
